# Patient Record
Sex: Male | Born: 1950 | Race: White | Hispanic: No | Marital: Married | Smoking: Never smoker
Health system: Southern US, Community
[De-identification: ages and names within clinical notes are randomized; demographics above are authoritative.]

## PROBLEM LIST (undated history)

## (undated) DIAGNOSIS — I10 Essential (primary) hypertension: Secondary | ICD-10-CM

## (undated) DIAGNOSIS — M545 Low back pain, unspecified: Secondary | ICD-10-CM

## (undated) DIAGNOSIS — K219 Gastro-esophageal reflux disease without esophagitis: Secondary | ICD-10-CM

## (undated) DIAGNOSIS — M1990 Unspecified osteoarthritis, unspecified site: Secondary | ICD-10-CM

## (undated) DIAGNOSIS — J349 Unspecified disorder of nose and nasal sinuses: Secondary | ICD-10-CM

## (undated) DIAGNOSIS — M25512 Pain in left shoulder: Secondary | ICD-10-CM

## (undated) DIAGNOSIS — Z973 Presence of spectacles and contact lenses: Secondary | ICD-10-CM

## (undated) DIAGNOSIS — G629 Polyneuropathy, unspecified: Secondary | ICD-10-CM

## (undated) DIAGNOSIS — Z860100 Personal history of colon polyps, unspecified: Secondary | ICD-10-CM

## (undated) DIAGNOSIS — R7303 Prediabetes: Secondary | ICD-10-CM

## (undated) DIAGNOSIS — K469 Unspecified abdominal hernia without obstruction or gangrene: Secondary | ICD-10-CM

## (undated) DIAGNOSIS — B360 Pityriasis versicolor: Secondary | ICD-10-CM

## (undated) DIAGNOSIS — D3613 Benign neoplasm of peripheral nerves and autonomic nervous system of lower limb, including hip: Secondary | ICD-10-CM

## (undated) DIAGNOSIS — E785 Hyperlipidemia, unspecified: Secondary | ICD-10-CM

## (undated) HISTORY — DX: Gastro-esophageal reflux disease without esophagitis: K21.9

## (undated) HISTORY — DX: Essential (primary) hypertension: I10

## (undated) HISTORY — DX: Pain in left shoulder: M25.512

## (undated) HISTORY — PX: FOOT SURGERY: SHX648

## (undated) HISTORY — DX: Benign neoplasm of peripheral nerves and autonomic nervous system of lower limb, including hip: D36.13

## (undated) HISTORY — PX: COLON SURGERY: SHX602

## (undated) HISTORY — PX: FOOT BONE EXCISION: SUR493

## (undated) HISTORY — DX: Unspecified abdominal hernia without obstruction or gangrene: K46.9

## (undated) HISTORY — DX: Personal history of colon polyps, unspecified: Z86.0100

## (undated) HISTORY — DX: Unspecified disorder of nose and nasal sinuses: J34.9

## (undated) HISTORY — PX: HEMORRHOID SURGERY: SHX153

## (undated) HISTORY — DX: Hyperlipidemia, unspecified: E78.5

## (undated) HISTORY — DX: Pityriasis versicolor: B36.0

## (undated) HISTORY — PX: APPENDECTOMY: SHX54

## (undated) HISTORY — PX: OTHER SURGICAL HISTORY: SHX169

## (undated) HISTORY — DX: Low back pain, unspecified: M54.50

## (undated) HISTORY — PX: KNEE ARTHROPLASTY: SHX992

## (undated) HISTORY — DX: Low back pain: M54.5

## (undated) HISTORY — PX: CHOLECYSTECTOMY: SHX55

## (undated) HISTORY — PX: COLONOSCOPY: SHX174

## (undated) HISTORY — DX: Personal history of colonic polyps: Z86.010

## (undated) HISTORY — DX: Unspecified osteoarthritis, unspecified site: M19.90

## (undated) HISTORY — PX: TONSILLECTOMY AND ADENOIDECTOMY: SHX28

---

## 1999-10-18 ENCOUNTER — Ambulatory Visit (HOSPITAL_COMMUNITY): Admission: RE | Admit: 1999-10-18 | Discharge: 1999-10-18 | Payer: Self-pay | Admitting: Gastroenterology

## 2002-03-22 ENCOUNTER — Observation Stay (HOSPITAL_COMMUNITY): Admission: RE | Admit: 2002-03-22 | Discharge: 2002-03-23 | Payer: Self-pay | Admitting: Orthopedic Surgery

## 2003-10-28 ENCOUNTER — Inpatient Hospital Stay (HOSPITAL_COMMUNITY): Admission: RE | Admit: 2003-10-28 | Discharge: 2003-11-03 | Payer: Self-pay | Admitting: Orthopedic Surgery

## 2004-03-12 ENCOUNTER — Observation Stay (HOSPITAL_COMMUNITY): Admission: RE | Admit: 2004-03-12 | Discharge: 2004-03-13 | Payer: Self-pay | Admitting: Surgery

## 2004-03-12 ENCOUNTER — Encounter (INDEPENDENT_AMBULATORY_CARE_PROVIDER_SITE_OTHER): Payer: Self-pay | Admitting: *Deleted

## 2004-09-29 ENCOUNTER — Ambulatory Visit (HOSPITAL_COMMUNITY): Admission: RE | Admit: 2004-09-29 | Discharge: 2004-09-29 | Payer: Self-pay | Admitting: Gastroenterology

## 2004-09-29 ENCOUNTER — Encounter (INDEPENDENT_AMBULATORY_CARE_PROVIDER_SITE_OTHER): Payer: Self-pay | Admitting: *Deleted

## 2004-10-13 ENCOUNTER — Ambulatory Visit (HOSPITAL_COMMUNITY): Admission: RE | Admit: 2004-10-13 | Discharge: 2004-10-13 | Payer: Self-pay | Admitting: Orthopedic Surgery

## 2005-03-14 HISTORY — PX: CARDIAC CATHETERIZATION: SHX172

## 2005-03-24 ENCOUNTER — Ambulatory Visit (HOSPITAL_COMMUNITY): Admission: RE | Admit: 2005-03-24 | Discharge: 2005-03-24 | Payer: Self-pay | Admitting: Orthopedic Surgery

## 2005-10-10 ENCOUNTER — Inpatient Hospital Stay (HOSPITAL_COMMUNITY): Admission: EM | Admit: 2005-10-10 | Discharge: 2005-10-11 | Payer: Self-pay | Admitting: *Deleted

## 2007-03-20 ENCOUNTER — Inpatient Hospital Stay (HOSPITAL_COMMUNITY): Admission: RE | Admit: 2007-03-20 | Discharge: 2007-03-28 | Payer: Self-pay | Admitting: Orthopedic Surgery

## 2007-03-26 ENCOUNTER — Encounter (INDEPENDENT_AMBULATORY_CARE_PROVIDER_SITE_OTHER): Payer: Self-pay | Admitting: Orthopedic Surgery

## 2007-03-26 ENCOUNTER — Ambulatory Visit: Payer: Self-pay | Admitting: Vascular Surgery

## 2010-07-27 NOTE — Op Note (Signed)
[name], [name]             ACCOUNT NO.:  [id number]   MEDICAL RECORD NO.:  [id number]          PATIENT TYPE:  INP   LOCATION:  0005                         FACILITY:  [facility]   PHYSICIAN:  Marlowe Kays, M.D.  DATE OF BIRTH:  16-Nov-1950   DATE OF PROCEDURE:  03/20/2007  DATE OF DISCHARGE:                               OPERATIVE REPORT   PREOPERATIVE DIAGNOSIS:  Osteoarthritis, left knee.   POSTOPERATIVE DIAGNOSIS:  Osteoarthritis, left knee.   OPERATION:  Osteonics total knee replacement, left.   SURGEON:  Marlowe Kays, M.D.   ASSISTANTDruscilla Brownie. Idolina Primer, P.A.-C.   PATHOLOGY AND JUSTIFICATION FOR PROCEDURE:  He has had prior successful  right total knee replacement a number of years ago.  His left knee has  become progressively more painful despite all measures other than a knee  replacement.  He has significant medial compartment narrowing on plane x-  rays with some medial shift of the femur onto the tibia.   DESCRIPTION OF PROCEDURE:  Prophylactic antibiotics, satisfactory  general anesthesia, pneumatic tourniquet, Surefoot and lateral hip  stabilizer, left leg was prepped with DuraPrep, draped in a sterile  field, Ioban employed, time out performed.  Vertical midline incision  down to the patellar mechanism with medial parapatellar incision to open  the joint.  Osteophytes from around the patella and the femur were  resected and the anterior portion of the menisci and most of the ACL and  PCL complex was removed.  The patellar mechanism was freed up enough  that the patella could be everted and the knee flexed.  I released the  pes anserinus and the tibial attachment of the medial collateral  ligament from the tibia.  I then made a 5/16 inch drill hole in the  distal femur followed by the canal finder and the axis aligner set for a  5 degrees valgus cut to the left knee.  He had no flexion contracture,  so a 10 mm distal femoral cut was made.  I then sized  the femur with the  sizing jig at #9 and placed stabilization holes using this jig and then  applied the distal femoral cutting jig for anterior and posterior cuts  and posterior and anterior chamfer.  After completion of these, I went  to the tibia where I removed remnants of the ACL and PCL complex and the  menisci.  A leveling cut was made and the tibia sized at a #9.  Using  the #9 baseplate, I made my initial drill hole in the proximal tibia  followed by the step cut drill, canal finder, and the intramedullary rod  with external cutting jig for a 90 degree cut of 4 mm off the medial  tibial plateau.  Having made this cut, I then placed the laminar  spreader and removed remnants of bone and soft tissue from the height of  the femoral condyles.  I next placed the jig for creating the  notchplasty in the patellar groove, both of which were performed without  complication.  I then went through a trial reduction, found that a 10 mm  spacer seemed to give an excellent fit.  Using the external alignment  rods, I marked scribe lines on the tibia for eventual placement of the  tibial component.  While the knee was in extension, I then sized the  patella and used a re-surfacing 9, created three drill holes.  I then  returned to the tibia.  I placed the 9 baseplate based on the previously  noted scribe lines and used the tripod apparatus to ream for the tibial  keel up to a #9.  I filed this by using the hand reamer for the 80 mm  extension which I felt was appropriate in this particular case because  of his varus deformity and he is a large man.  I reamed up to a 14 mm  requiring the use of a Boss reamer to the proximal tibia and tibial  plateau.  I then went ahead and went through a trial reduction with the  tibial component with a 14 by 80 mm extension and it fit nicely.  I then  went ahead and water picked the knee while the components were opened  and assembled.  We then mixed the methyl  methacrylate and inserted it  with a glue gun, placing some glue on the posterior flanges of the  femoral component and on the under surface of the tibial component down  to the extension.  The components were then individually glued in and  impacted with excess methacrylate being removed.  A patellar clamp was  used on the patella. The glue had hardened and small amounts of  methacrylate from around the components were removed, and I went through  a second trial reduction and found out, once again, that the 10 mm  spacer was the best fit.  Accordingly, after irrigating the wound well  and making sure there were no particulate pieces of bone or methyl  methacrylate or any soft tissues interposed, I then placed the final  posterior stabilized 10 mm spacer, impacting it and taking the knee  through range of motion with nice mechanics for the knee, no flexion  contracture, and good stability.  The patella did not require a lateral  release.  A Hemovac was then placed and the wound closed with  interrupted #1 Vicryl in two layers in the quadriceps tendon and two  layers distally in the synovium and capsule, the subcutaneous tissues  were closed with 2-0 Vicryl, the skin was closed with staples.  The  tourniquet was released with 2 hours 10 minutes of tourniquet time  having lapsed at 300 mmHg.  A Betadine and Adaptic dry, sterile dressing  were applied.  He tolerated the procedure well and was taken to the  recovery room in satisfactory condition with no known complications.           ______________________________  Marlowe Kays, M.D.     JA/MEDQ  D:  03/20/2007  T:  03/20/2007  Job:  [id number]

## 2010-07-27 NOTE — H&P (Signed)
[name], [name]             ACCOUNT NO.:  [id number]   MEDICAL RECORD NO.:  [id number]          PATIENT TYPE:  INP   LOCATION:  NA                           [facility]ACILITY:  [facility]   PHYSICIAN:  Marlowe Kays, M.D.  DATE OF BIRTH:  [birth year]   DATE OF ADMISSION:  03/20/2007  DATE OF DISCHARGE:                              HISTORY & PHYSICAL   CHIEF COMPLAINT:  Pain in my left knee.   PRESENT ILLNESS:  This 60 year old white male seen by [place] for continuing  progressive problems concerning pain in his left knee.  He has had  problems with this knee, has undergone left knee arthroscopy in 2004,  and has done fairly well since then, but now it is markedly interfering  with his day-to-day activities.  He has had a right total knee  replacement arthroplasty, has done very well with that.  That surgery  was performed in 2005.  He has developed a varus deformity as well as  leg shortening.  Due to this fact, he now has intermittent low-back pain  and discomfort.  It is felt it is stemming primarily from the  malalignment of the left lower extremity.  X-rays showed bone-on-bone  deformity in the medial joint with tricompartmental arthritis present as  well.  After much consideration, including the risks and benefits of  surgery, it was decided to go ahead with total knee replacement  arthroplasty to the left knee.   PAST MEDICAL HISTORY:  1. This patient has hypertension.  2. Reflux.  3. Hypercholesterolemia.   PAST SURGICAL HISTORY:  His previous surgeries have been  1. Appendectomy in the past.  2. Tonsillectomy.  3. Hemorrhoidectomy.  4. He has had several colonoscopies, one in 1990, 1995, 2000 and 2006.  5. Right knee arthroscopy in 1996 and 1997, resulting in a total knee      in 2005.  6. He had left knee arthroscopy in 2004.  7. Left foot surgery in 2004, as well.  8. Cholecystectomy in 2005.  9. Right foot surgery in 2006.  10.He had another left knee scope in 2007.  11.An essentially negative heart catheterization done in 2007.   ALLERGIES:  He is allergic to CODEINE and DEMEROL, but he can take  hydrocodone and oxycodone without too much problem.   CURRENT MEDICATIONS:  Under the direction of Dr. Duane Lope,  1. Benicar/hydrochlorothiazide 40/12.5 mg one daily.  2. Pravastatin sodium 40 mg one tablet daily.  3. Protonix 40 mg one tablet daily.  4. He is also supplementing that with aspirin 81 mg daily and fish oil      omega three 1200 mg supplement 1 tablet daily (will stop these two      last medications 10 days prior to surgery).  5. He has used Toradol in the past for discomfort, but does not take      it at the current time.   FAMILY HISTORY:  Positive for his father who died at 37 years of age of  a heart attack and mother at 84 years of age for heart attack.  He has  one sister  who died of heart failure.   SOCIAL HISTORY:  The patient is married.  He is retired.  No intake of  tobacco products.  Occasional intake of EtOH.  No street drugs.  The  patient lives with his family.  His wife will be a caregiver after  surgery along with home health.  Housing:  He lives in a two level home.  He has a living will.   REVIEW OF SYSTEMS:  CNS:  No seizures disorder, paralysis, numbness,  double vision.  RESPIRATORY:  No productive cough, no hemoptysis or  shortness of breath.  CARDIOVASCULAR:  No chest pain, no angina or  orthopnea.  GASTROINTESTINAL:  No nausea, vomiting, melena or bloody  stool.  GENITOURINARY:  No discharge, dysuria or hematuria.  MUSCULOSKELETAL:  Primarily in present illness, his left knee.   PHYSICAL EXAMINATION:  GENERAL APPEARANCE:  Alert, cooperative and  friendly, slightly anxious 60 year old white male who is 6 feet 2 and  230 pounds.  VITAL SIGNS:  Blood pressure 138/78, respirations 12, pulse 80.  HEENT: Normocephalic, PERRLA.  Oropharynx is clear.  CHEST:  Clear to auscultation, no rhonchi or rales.  HEART:  Regular rate and rhythm.  No murmurs are heard.  ABDOMEN:  Somewhat obese, soft, nontender.  Liver and spleen not felt.  GENITALIA/RECTAL:  Not done, not pertinent to present illness.  EXTREMITIES:  The patient has good neurovascular to left lower  extremities, crepitus of range of motion and varus deformity as  mentioned above.   ADMISSION DIAGNOSIS:  1. 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  2. Hypertension.  3. Dyslipidemia.  4. Reflux.   PLAN:  The patient will undergo left total knee replacement  arthroplasty.  We do plan to have him go home after his regular  hospitalization with home health with [place].  His wife will be the  major caregiver.      [name].    ______________________________  Marlowe Kays, M.D.    DLU/MEDQ  D:  03/06/2007  T:  03/07/2007  Job:  [id number]   cc:   C. Duane Lope, M.D.  Fax: [phone]

## 2010-07-27 NOTE — Consult Note (Signed)
[name], [name]             ACCOUNT NO.:  [id number]   MEDICAL RECORD NO.:  [id number]          PATIENT TYPE:  INP   LOCATION:  1617                         FACILITY:  [facility]   PHYSICIAN:  Shirley Friar, MDDATE OF BIRTH:  Dec 18, 1950   DATE OF CONSULTATION:  DATE OF DISCHARGE:                                 CONSULTATION   REQUESTING PHYSICIAN:  Dr. Simonne Come   INDICATION:  Abdominal pain.   HISTORY OF PRESENT ILLNESS:  Mr. [name] is a 60 year old pleasant  white male who is status post total knee replacement on the left on  March 20, 2007.  Since surgery he has developed some left lower  quadrant and suprapubic abdominal pain and distention and therefore this  consult was called for evaluation.  He does have a history of  postoperative ileus when he had his right knee replaced in the past.  He  reports that he ate breakfast yesterday and lunch yesterday without any  problems but then the pain worsened that afternoon and he was unable to  eat dinner.  He was given a suppository yesterday and reported loose  stools following that suppository.  He denies any vomiting but has had  some nausea.   PAST MEDICAL HISTORY:  1. Hypertension.  2. Hypercholesterolemia.  3. History of reflux.  4. Status post cholecystectomy.  5. History of polyps.  6. History of feet and knee surgery.   MEDICINES ON ADMISSION:  Aspirin, Protonix, pravastatin,  Benicar/hydrochlorothiazide, ibuprofen, ketorolac, Tylenol, fish oil.   CURRENT MEDICATIONS:  See chart.   ALLERGIES:  1. MORPHINE.  2. DEMEROL.  3. CODEINE/   Codeine causes a rash, morphine causes nausea, vomiting and diarrhea,  and Demerol causes nausea and vomiting.   PHYSICAL EXAM:  VITAL SIGNS:  Temperature 100.3, pulse 95, blood  pressure 120/77, O2 saturations 93% on room air.  GENERAL:  Alert, no acute distress.  ABDOMEN:  Distended, tender in left lower quadrant with guarding, no  rebound, positive bowel sounds.   LABORATORIES:  White blood count 14.1, hemoglobin 12.1, platelet count  224.  INR 1.5.  Potassium 3.5.   IMPRESSION:  A 60 year old white male with abdominal pain and abdominal  distention concerning for ileus versus small-bowel obstruction.  X-ray  was done today which showed no air-fluid level but did show some  distention of the abdomen likely due to ileus.  I think he has  postoperative ileus and not an obstruction.  No indication to place an  NG tube at this time.  Will schedule IV Reglan 10 mg q.6h. x2 days and  follow serial x-rays.  Place the patient on clear-liquid diet and follow  closely.   Thank you for this consultation and opportunity to help manage his  abdominal pain.      Shirley Friar, MD  Electronically Signed     VCS/MEDQ  D:  03/22/2007  T:  03/22/2007  Job:  [id number]   cc:   Danise Edge, M.D.  Fax: [phone]   Marlowe Kays, M.D.  Fax: [phone]

## 2010-07-27 NOTE — Op Note (Signed)
NAMESTILES, [name] NO.:  [id number]   MEDICAL RECORD NO.:  [id number]          PATIENT TYPE:  INP   LOCATION:  1617                         FACILITY:  [facility]   PHYSICIAN:  Danise Edge, M.D.   DATE OF BIRTH:  11/01/1950   DATE OF PROCEDURE:  03/28/2007  DATE OF DISCHARGE:                               OPERATIVE REPORT   PROCEDURE INDICATIONS:  Mr. Rayshard Schirtzinger is a 60 year old male born  1950-11-27.  Mr. Zechman developed a postoperative ileus after  the surgery.  Repeat abdominal x-rays are consistent with either an  ileus or distal colonic obstruction.  The CT scan of the abdomen and  pelvis without intravenous contrast performed yesterday evening was  unremarkable; there was no signs of retroperitoneal bleeding.   ENDOSCOPIST:  Danise Edge, M.D.   PREMEDICATION:  None.   PROCEDURE:  Anal inspection and digital rectal exam were normal.  The  Pentax pediatric colonoscope was introduced into the rectum and advanced  to approximately 80 cm from the anal verge at which point a large amount  of predominantly liquid stool was encountered.   Endoscopic appearance of the rectum and colon with the endoscope  advanced to 80 cm from the anal verge was completely normal.  There is  no endoscopic evidence for the presence of colonic obstruction, C.  difficile colitis.   ASSESSMENT:  Resolving postoperative ileus.           ______________________________  Danise Edge, M.D.     MJ/MEDQ  D:  03/28/2007  T:  03/28/2007  Job:  [id number]   cc:   Marlowe Kays, M.D.  Fax: [phone]

## 2010-07-30 NOTE — H&P (Signed)
[name], [name]             ACCOUNT NO.:  [id number]   MEDICAL RECORD NO.:  [id number]          PATIENT TYPE:  INP   LOCATION:  1422                         [facility]ACILITY:  [facility]   PHYSICIAN:  Kela Millin, M.D.DATE OF BIRTH:  Dec 31, 1950   DATE OF ADMISSION:  10/10/2005  DATE OF DISCHARGE:                                HISTORY & PHYSICAL   PRIMARY CARE PHYSICIAN:  Dr. Foye Deer.   CHIEF COMPLAINT:  Chest pain.   HISTORY OF PRESENT ILLNESS:  The patient is a 60 year old white male with a  past medical history significant for hypertension and hyperlipidemia who  presents with complaints of chest pain x2 hours. He states that upon waking  up in the morning he began experiencing pain that went through his back to  the shoulder blade area. Shortly thereafter it progressed to a left  precordial discomfort/pain that he describes as a tightness, 3-4/10 in  intensity and he states that he felt flushed. He states that the pain also  radiated up his neck to the jaw area and that he felt some numbness around  the jaw area, and also that the pain radiated down his left arm with some  tingling. He states that he has had some cervical degenerative disk disease  and so intermittently he does have some pain in his shoulder that radiates  down his arm with some paresthesias at times but that he has attributed most  of the time to the cervical disk disease but that this time the symptoms  felt different than in those times. He denies nausea and vomiting, ?  shortness of breath. He states that he is not aware of any precipitating  factors and that upon arrival to the ER, he was given a nitroglycerin but he  is not sure whether it made any difference to the pain although the pain  resolved sometime after he got to the ER. He also states that he had some  epigastric burning sensation while he was in the ER and in the past he has  had similar symptoms but never been diagnosed or treated for  GERD.   The patient was seen in the ER and an EKG showed normal sinus rhythm with no  ischemic changes, his point of care markers were negative and he was  admitted to the [facility] hospitalist service for further evaluation and  management.   PAST MEDICAL HISTORY:  1. As stated above.  2. History of osteoarthritis - status post right knee replacement also      status post left knee surgery x2.  3. History of colitis.   PAST SURGICAL HISTORY:  History of cholecystectomy, also history of  appendectomy.   MEDICATIONS:  1. Aspirin 81 mg daily.  2. Benicar 40/12.5 mg daily.  3. Pravachol 80 mg p.o. daily.  4. Zetia 10 mg p.o. daily.   ALLERGIES:  CODEINE and DEMEROL.   SOCIAL HISTORY:  He denies tobacco, also denies alcohol.   FAMILY HISTORY:  His dad died of an MI at age 29, mom is deceased, had her  first MI at age 37. Her sisters  have diabetes mellitus, mom and dad also had  diabetes mellitus, sisters also have hypertension.   REVIEW OF SYSTEMS:  The patient denies cough, fevers, PND, orthopnea, leg  swelling, melena, dysuria, and no hematochezia. The rest of the review of  systems as per HPI.   PHYSICAL EXAMINATION:  GENERAL:  The patient is a pleasant, middle-aged  male, alert and oriented in no apparent distress.  VITAL SIGNS:  His temperature is 98.6, blood pressure 145/93, initially  166/105. His pulse is 65, initially 132, respiratory rate is 20. O2 sat is  97%.  HEENT:  PERRL, EOMI, sclera anicteric, moist mucous membranes, no oral  exudates.  NECK:  Supple, no adenopathy, no thyromegaly, no JVD.  LUNGS:  Clear to auscultation bilaterally, no crackles or wheezes.  CARDIOVASCULAR:  Regular rate and rhythm, normal S1, S2, no S3 appreciated.  ABDOMEN:  Soft, bowel sounds present, nontender, nondistended, no  organomegaly and no masses palpable.  EXTREMITIES:  No cyanosis and no edema.  NEUROLOGIC:  Alert and oriented x3. Cranial nerves II-XII grossly intact.  Sensory  grossly intact. Nonfocal exam.   LABORATORY DATA:  EKG - normal sinus rhythm, no ischemic changes, D-dimer is  0.28. Point of care markers negative x2. Chest x-ray - no acute findings.   His sodium is 137, potassium 4.1, chloride 107, CO2 is 25, glucose 103, BUN  11, creatinine 1.1. His calcium is 9.1, total protein 7.0, albumin 12.1, AST  34, ALT 54, alkaline phosphatase 53, total bilirubin 0.8.   ASSESSMENT/PLAN:  1. Chest pain - As discussed above, risk factors, hypertension and      hyperlipidemia. Will obtain serial cardiac enzymes and also place the      patient on aspirin, nitrates. Manage her on telemetry bed. Consult      cardiology for possible stress test if cardiac enzymes negative. Will      also place patient on proton pump inhibitor to cover for      gastrointestinal etiology.  2. Hypertension - Will continue Benicar HCT.  3. Hyperlipidemia - Continue Zetia and Pravachol.      Kela Millin, M.D.  Electronically Signed     ACV/MEDQ  D:  10/11/2005  T:  10/11/2005  Job:  [id number]   cc:   Dellis Anes. Idell Pickles, M.D.  Fax: [phone]

## 2010-07-30 NOTE — Op Note (Signed)
NAMESKYELAR, [name]             ACCOUNT NO.:  [id number]   MEDICAL RECORD NO.:  [id number]          PATIENT TYPE:  AMB   LOCATION:  ENDO                         FACILITY:  [facility]   PHYSICIAN:  Danise Edge, M.D.   DATE OF BIRTH:  14-Nov-1950   DATE OF PROCEDURE:  09/29/2004  DATE OF DISCHARGE:                                 OPERATIVE REPORT   PROCEDURE:  Colonoscopy.   REFERRING PHYSICIAN:  Dr. Foye Deer.   INDICATIONS:  [name] is a 60 year old male born April 22, 1950. [name] father had colon cancer. His sister and brother have  undergone colonoscopic exams to remove colon polyps.   ENDOSCOPIST:  Danise Edge, M.D.   PREMEDICATION:  Versed and 10 mg, fentanyl 75 mcg.   DESCRIPTION OF PROCEDURE:  After obtaining informed consent, [name]  was placed in the left lateral decubitus position. I administered  intravenous fentanyl and intravenous Versed to achieve conscious sedation  for the procedure. The patient's blood pressure, oxygen saturation and  cardiac rhythm were monitored throughout the procedure and documented in the  medical record.   Anal inspection was normal. Digital rectal exam reveals a nonnodular  prostate. The Olympus adjustable pediatric colonoscope was introduced into  the rectum and advanced to the cecum. A normal-appearing ileocecal valve and  appendiceal orifice were identified and photographed. Colonic preparation  exam today was excellent.   RECTUM:  Two 1 mm sessile polyps were removed from the mid rectum with the  cold biopsy forceps.  SIGMOID COLON AND DESCENDING COLON:  Normal.  SPLENIC FLEXURE:  Normal.  TRANSVERSE COLON:  Normal.  HEPATIC FLEXURE:  Normal.  ASCENDING COLON:  Normal.  CECUM AND ILEOCECAL VALVE:  Normal. Two small diverticula are noted in the  cecum.   ASSESSMENT:  Two diminutive polyps were removed from the rectum with the  cold biopsy forceps; otherwise normal proctocolonoscopy to the  cecum.   RECOMMENDATIONS:  Repeat colonoscopy in five years.       MJ/MEDQ  D:  09/29/2004  T:  09/29/2004  Job:  [id number]   cc:   Dellis Anes. Idell Pickles, M.D.  [address]  [place]  [place] [address]  Fax: [phone]

## 2010-07-30 NOTE — H&P (Signed)
NAME:  [name], [name]                       ACCOUNT NO.:  [id number]   MEDICAL RECORD NO.:  [id number]                   PATIENT TYPE:  INP   LOCATION:  0468                                 FACILITY:  [facility]   PHYSICIAN:  Marlowe Kays, M.D.               DATE OF BIRTH:  [birth year]   DATE OF ADMISSION:  10/28/2003  DATE OF DISCHARGE:  11/03/2003                                HISTORY & PHYSICAL   ADMISSION DIAGNOSES:  1.  Severe osteoarthritis of right knee.  2.  Hypertension.  3.  Resolving colitis.   DISCHARGE DIAGNOSES:  1.  Severe osteoarthritis of right knee, status post total knee replacement.  2.  Hypertension.  3.  Resolving colitis.  4.  Postoperative anemia.  5.  Postoperative abdominal distention, resolved.   OPERATION:  On October 28, 2003, the patient underwent Osteonics total knee  replacement arthroplasty of the right knee.  Dr. Durene Romans assisted.   CONSULTATIONS:  Danise Edge, M.D., gastroenterology.   BRIEF HISTORY:  This 60 year old gentleman had continued progressive  problems concerning pain in his right knee.  The patient has had some trauma  in the past and now presents with medial compartment narrowing and near bone-  on-bone deformity to his both knees.  He is having more pain to the right  knee.  Left knee has shown radiographic changes.  He felt he would benefit  from surgical intervention and is being admitted for total knee replacement  arthroplasty of the right knee.   The patient's family physician is Dellis Anes. Heller, M.D.   COURSE IN HOSPITAL:  The patient tolerated the surgical procedure quite  well.  He was placed on Coumadin protocol postoperatively for prevention of  DVT.  He was seen by both physical therapy and occupational therapy for ADLs  and total knee protocol.  The patient was doing well until the postoperative  day #3 when he began having distention with nausea and vomiting.  We asked  Dr. Laural Benes to see this patient.  He  recommended we cut way back on the  narcotics.  This seemed to relieve his abdominal discomfort.  The C.  difficile toxin was negative.  The x-rays showed no dilatation, and it was  assumed he just had a postoperative distention secondary to hypoactivity of  the gut secondary to analgesics.   As far as his knee was concerned, he progressed very nicely.  Arrangements  were made for him to be seen at home with [facility].  On the day of discharge,  seen by Dr. Simonne Come, had minimal pain, anxious to go home with home  health.  Arrangements were made for discharge.   LABORATORY AND X-RAY DATA:  Laboratory values in the hospital  hematologically showed a preoperative CBC completely within normal limits.  Final hemoglobin was 9.2, hematocrit 26.8.  Blood chemistries all were  normal other than a very mild hypokalemia and hyponatremia postoperatively.  C.  difficile toxin was negative.  Urinalysis was negative as well.   Electrocardiogram showed normal ECG.   Chest x-ray showed no active disease.   CONDITION ON DISCHARGE:  Improved, stable.   PLAN:  The patient is discharged to his home in the care of his family.  He  is to continue with his home medication and diet under the direction of Dr.  Foye Deer.   OUR MEDICATIONS:  1.  Coumadin per pharmacy  2.  Darvocet-N 100, #30 with a refill, 1 to 2 q.4-6h. p.r.n. pain.  3.  Robaxin, #30 with a refill, 1 q.6h. p.r.n. muscle spasm.   Continue with home physical therapy.     [name].                 Marlowe Kays, M.D.    DLU/MEDQ  D:  11/18/2003  T:  11/18/2003  Job:  [id number]   cc:   Dr. Foye Deer [phone])

## 2010-07-30 NOTE — Op Note (Signed)
[name], [name]             ACCOUNT NO.:  [id number]   MEDICAL RECORD NO.:  [id number]          PATIENT TYPE:  OUT   LOCATION:  DAY                          FACILITY:  [facility]   PHYSICIAN:  Marlowe Kays, M.D.  DATE OF BIRTH:  [birth year]   DATE OF PROCEDURE:  03/24/2005  DATE OF DISCHARGE:                                 OPERATIVE REPORT   PREOPERATIVE DIAGNOSES:  1.  Torn medial meniscus.  2.  Osteoarthritis, left knee.   POSTOPERATIVE DIAGNOSES:  1.  Torn medial meniscus.  2.  Fraying of lateral meniscus.  3.  Osteoarthritis.  4.  Osteocartilaginous fragments with impingement symptoms in medial joint.   OPERATION:  Left knee arthroscopy with 1) partial medial and lateral  meniscectomy, 2) debridement of medial femoral condyle, 3) removal of  osteochondral fragment from medial joint.   SURGEON:  Marlowe Kays, M.D.   ASSISTANT:  Nurse.   ANESTHESIA:  General.   PATHOLOGY AND JUSTIFICATION FOR PROCEDURE:  He is known to have  osteoarthritis of his left knee and has had a prior knee arthroscopy. He was  doing well however until March 15, 2005 when he slipped and fell on some  bird seed at a [employer]. He had multiple contusions, but the major  injury seemed to be to his left knee. I saw him twice on March 16, 2005 and  an MRI was ordered on March 22, 2005 demonstrating a torn medial meniscus  and osteoarthritis of the knee. The fragment was not noted on the MRI. It  was my thought at surgery that this was the dominant finding causing him to  have his feelings of locking and pain.   DESCRIPTION OF PROCEDURE:  Prophylactic antibiotics, because of prior right  knee replacement, the right leg was wrapped with Ace wrap and protected with  a pad beneath it, pneumatic tourniquet applied to the left leg which was  esmarched out sterilely and prepped with DuraPrep from thigh stabilizer to  ankle and draped in a sterile field. Superior medial saline inflow.  First  through an anteromedial portal, the lateral compartment of the knee joint  was evaluated. He had some mild arthritic changes, grade 2/4 of the lateral  femoral condyle with some minimal shaving performed. He also had a similar  amount of wear of the lateral tibial plateau. There was some moderate  fraying of the inner border of the lateral meniscus as noted on the MRI. I  shaved down the lateral meniscus and the lateral tibial plateau. Next  looking up in the lateral gutter and suprapatellar area, the patellar  surface looked unremarkable. I then reversed portals. I found areas of full-  thickness wear in both the tibial plateau and medial femoral condyle with  several areas of recurrent tears along the posterior curve. The dominant  finding as noted, however was a slightly greater than 1 cm x slightly  greater than 1/2 a centimeter osteochondral fragment lying adjacent to the  ACL medially which was impinged between the medial femoral condyle and the  medial tibial plateau. This was pictured, removed with a pituitary  rongeur  and saved for the patient. I then debrided down the medial femoral condyle  and shaved down the medial meniscus until smooth with a 3.5 shaver. The knee  was then irrigated well with sterile saline until clear and the two anterior  portals were closed with 4-0 nylon. I then injected 20 mL of  0.5% Marcaine  with adrenalin through the inflow apparatus which was removed and this  portal closed with 4-0 nylon as well. Betadine Adaptic dry sterile dressing  were applied. The tourniquet was released. He tolerated the procedure well  and was taken to the recovery room in satisfactory condition with no known  complications.           ______________________________  Marlowe Kays, M.D.     JA/MEDQ  D:  03/24/2005  T:  03/25/2005  Job:  [id number]

## 2010-07-30 NOTE — Procedures (Signed)
[facility]  Patient:    Steve, Crawford                      MRN: [id number] Attending:  Verlin Grills, M.D. CC:         Dellis Anes. Idell Pickles, M.D.                           Procedure Report  REFERRING PHYSICIAN:  Dellis Anes. Idell Pickles, M.D.  PROCEDURE PERFORMED:  Colonoscopy.  ENDOSCOPIST:  Verlin Grills, M.D.  INDICATIONS FOR PROCEDURE:  [name] is a 59 year old male whose father was diagnosed with colon cancer.  [name] is due for colorectal neoplasia screening.  His 10 year old sister recently underwent a colonoscopy and multiple polyps were removed.  [name] underwent his last colonoscopy in 1996 which was normal.  I discussed with Mr. Ring the complications associated with colonoscopy and polypectomy including intestinal bleeding and intestinal perforation. [name] has signed the operative permit.  PREMEDICATION:  Versed 10 mg, Demerol 50 mg.  ENDOSCOPE:  Pediatric Olympus colonoscope.  DESCRIPTION OF PROCEDURE:  After obtaining informed consent, the patient was placed in the left lateral decubitus position.  I administered intravenous Demerol and intravenous Versed to achieve conscious sedation for the procedure.  The patients blood pressure, oxygen saturation and cardiac rhythm were monitored throughout the procedure and documented in the medical record.  Anal inspection was normal.  Digital rectal exam revealed a nonnodular prostate.  The Olympus pediatric video colonoscope was then introduced into the rectum and under direct vision, advanced to the cecum as identified by a normal-appearing ileocecal valve.  Colonic preparation for the exam today was excellent.  Rectum:  Normal.  Sigmoid colon:  Normal.  Descending colon:  Normal.  Splenic flexure:  Normal.  Transverse colon:  Normal.  Hepatic flexure:  Normal.  Ascending colon:  Normal.  Cecum and ileocecal valve:  Normal.  ASSESSMENT:   Normal proctocolonoscopy to the cecum.  RECOMMENDATIONS:  Repeat colonoscopy in five years. DD:  10/18/99 TD:  10/19/99 Job: [id number]

## 2010-07-30 NOTE — Op Note (Signed)
[name], [name]             ACCOUNT NO.:  [id number]   MEDICAL RECORD NO.:  [id number]           PATIENT TYPE:   LOCATION:                                 FACILITY:   PHYSICIAN:  Marlowe Kays, M.D.       DATE OF BIRTH:   DATE OF PROCEDURE:  10/13/2004  DATE OF DISCHARGE:                                 OPERATIVE REPORT   PREOPERATIVE DIAGNOSIS:  Painful osteoarthritis, metacarpophalangeal joint,  left great toe.   POSTOPERATIVE DIAGNOSIS:  Painful osteoarthritis, metacarpophalangeal joint,  left great toe.   OPERATION:  Bunionectomy with debridement of metacarpophalangeal joint, left  great toe.   SURGEON:  Marlowe Kays, M.D.   ASSISTANT:  Nurse.   ANESTHESIA:  General.   JUSTIFICATION FOR PROCEDURE:  He has had a similar procedure on the right in  the past with good result.  Radiographically, he has had severe spurring  with at least one fragment in the dorsum of the MP joint on lateral x-ray in  the left great toe and over the last week has had severe pain and swelling  which has been incapacitating to him so he has been unable to walk.  Consequently is here today for the above-mentioned surgery.   PROCEDURE:  Prophylactic antibiotics, satisfactory general anesthesia,  pneumatic tourniquet. The leg was Esmarch out non-sterilely and prepped from  above the ankle to toes with DuraPrep and draped in a sterile field.  A  bunion type dorsal medial incision was made.  The dorsal sensory nerve was  protected.  The capsule was opened in line with the skin incision and the MP  joint exposed.  With a small double action rongeur.  I performed the  bunionectomy.  The bunion was not very prominent and then began removing a  large osteophyte from the dorsum of the first metatarsal head which gave me  access to removing a large single bony fragment which was lying free in the  dorsum of the MP joint.  I then continued debridement of the first  metatarsal head working  laterally, removing all bone,  and I felt I had good  visualization of the entire joint.  At the conclusion of the debridement, he  had excellent motion passively of the MP joint.  He then irrigated well with  sterile saline, infiltrated soft tissues with 0.5% plain Marcaine and closed  the wound with interrupted 0 Vicryl in the capsule and interrupted 4-0  ______ sutures in the skin and subcutaneous tissue.  Betadine, Adaptic and a  dry sterile dressing were applied. Tourniquet was released.  At the time of  dictation, he was on his way to the recovery room in satisfactory condition  with no known complications.           ______________________________  Marlowe Kays, M.D.     JA/MEDQ  D:  10/13/2004  T:  10/13/2004  Job:  [id number]

## 2010-07-30 NOTE — Discharge Summary (Signed)
[name]             ACCOUNT NO.:  [id number]   MEDICAL RECORD NO.:  [id number]          PATIENT TYPE:  INP   LOCATION:  1617                         FACILITY:  [facility]   PHYSICIAN:  Marlowe Kays, M.D.  DATE OF BIRTH:  01/10/1951   DATE OF ADMISSION:  03/20/2007  DATE OF DISCHARGE:  03/28/2007                               DISCHARGE SUMMARY   ADMITTING DIAGNOSES:  1. 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  2. Hypertension.  3. Dyslipidemia.  4. Reflux.   DISCHARGE DIAGNOSES:  1. 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  2. Hypertension.  3. Dyslipidemia.  4. Reflux.  5. Postoperative hyponatremia (corrected).  6. Dysmotility ileus postoperatively.   CONSULTS:  Gastroenterology including Dr. Laural Benes.   OPERATIONS:  On on March 20, 2007, the patient underwent left total  knee replacement arthroplasty.  D.L. Idolina Primer PA-C assisted.   BRIEF HISTORY:  This 60 year old gentleman seen by [place] for progressive  problems concerning the left knee.  He had a knee scope to the knee in  2004, did fairly well, but recently he has had increasing problems with  that knee.  He underwent a right total knee replacement arthroplasty,  did very well and now has high desires to increase his level of  activity, with total knee replacement arthroplasty on the left.  X-ray  showed bone-on-bone deformities, primarily in the medial joint, but he  has tricompartmental arthritis as well.  After much discussion including  risks and benefits of surgery, decided to go ahead with the above  surgery.   COURSE IN THE HOSPITAL:  The patient tolerated surgical procedure quite  well, but immediately began having abdominal discomfort with tenderness  to palpation.  Bowel sounds remained present early on, but he was quite  miserable.  He also had been noted to have some hyponatremia at 130.  High sodium drinks were offered, and this eventually was  corrected.   Gastroenterology felt that the patient had a postoperative ileus.  We  treated him with laxatives and diet change.  Gastroenterologist slowly  increased his diet, but he continued with pain, specifically in the  upper abdominal region.  Dr. Laural Benes saw the patient, did a with  flexible colonoscopy.  He saw that there was no colonic obstruction, no  colitis, no neoplasia.  He felt that primarily he was dealing with a  dysmotility ileus, and the patient was relieved after that procto-  colonoscopy at 80 cm.  Dr. Laural Benes recommended he stay on Reglan.   Orthopedically, the patient did very well postoperatively.  Wound  remained clean and dry, neurovascularly remained intact to the operative  extremity, calf remained soft.  He was ambulating in the hall, doing  really quite well with his new total knee.  He is able to tolerate the  CPM machine and after actually asked for it often.  Once he was cleared  by gastroenterology and was orthopedically stable, it was felt that he  could be discharged home with home therapy.   LABORATORY VALUES:  In the hospital, hematologically showed a  preoperative CBC with a hemoglobin of 14.1,  hematocrit was 40.5.  Final  hemoglobin was 9.4, hematocrit of 26.6 (he was asymptomatic).  Final PT  was 23.0, with an INR 2.0.  (The patient was on Coumadin protocol  postoperatively after Lovenox immediately, postoperatively.  Final  electrolytes showed a sodium of 137, potassium 3.8, glucose slightly  elevated at 106.  Urinalysis negative for urinary tract infection.  Electrocardiogram showed normal sinus rhythm, normal ECG.  Chest x-rays  preoperatively showed findings:  Dr. Elly Modena with no active  cardiopulmonary disease.  Postoperative knee films on the left showed  components in good position alignment.  Abdominal studies on March 22, 2007 showed mild diffuse gaseous distention of the colon, with air  scattered throughout the small bowel,  appearance not frankly  obstructive, more suggestive of dysmotility ileus.  Studies done on  March 23, 2007 showed gas-filled small bowel loops suggestive of ileus  without free peritoneal air.  The abdomen CT showed he had no pulmonary  embolus, atelectasis of left lung lobe was seen, no airspace disease.  Overall, Dr. Carlota Raspberry felt that technically adequate study without  pulmonary embolus or acute findings in the chest, fatty liver and  cholecystectomy, mild dependent atelectasis, subsegmental atelectasis  left lower lobe.  X-rays March 25, 2007 showed consistency with  postoperative ileus, not resolved compared to March 23, 2007 by Dr.  Mayford Knife.  Dr. Hope Budds, on abdomen films of March 26, 2007, felt that  ileus was present.  CT of the pelvis showed negative pelvis CT and no  retroperitoneal hemorrhage.   CONDITION ON DISCHARGE:  Improved, stable.   PLAN:  The patient will continue with Reglan, per Dr. Laural Benes.  He is to  continue with home medications, Benicar, Pravastatin, Protonix.  Return  to see [place], approximately 2 weeks after date of surgery.  May weight bear  as tolerated.  He is to call, should he have any problems or questions,  use ice to the knee.      [name].    ______________________________  Marlowe Kays, M.D.    DLU/MEDQ  D:  04/11/2007  T:  04/11/2007  Job:  [id number]   cc:   Marlowe Kays, M.D.  Fax: [phone]

## 2010-07-30 NOTE — H&P (Signed)
NAME:  [name], [name]                       ACCOUNT NO.:  [id number]   MEDICAL RECORD NO.:  [id number]                   PATIENT TYPE:  INP   LOCATION:  NA                                   FACILITY:  [facility]   PHYSICIAN:  Marlowe Kays, M.D.               DATE OF BIRTH:  April 03, 1950   DATE OF ADMISSION:  10/28/2003  DATE OF DISCHARGE:                                HISTORY & PHYSICAL   DATE OF HISTORY AND PHYSICAL:  October 16, 2003.   CHIEF COMPLAINT:  Pain in my right knee.   HISTORY OF PRESENT ILLNESS:  This is a 60 year old white male seen by Dr.  Simonne Come for continuing and progressive problems concerning his right knee.  The patient has had knee pain for a long period of time.  He had primary  onset beginning in the fall of 2004 when he was deer hunting and got tangled  with his dogs.  This primarily aggravated his shoulder and AC joint but it  aggravated his knees as well.  He has had locking and catching into the knee  with a considerable amount of pain and discomfort.  He has particular  problems with squatting and coming to a standing position.  We have  discussed the possibility and probability of total knee replacement in the  future with this gentleman and, unfortunately, it has come to the point  where he is having more and more difficulty getting about.  He has grating  with range of motion and a valgus deformity.  X-rays have shown medial  compartment narrowing with joint space near-obliteration on both knees.  His  left knee actually looks worse radiographically, but he is having more pain  into the right knee.  After much discussion, including the complications of  surgery, it was decided to go ahead with total knee replacement arthroplasty  to the right knee.   PAST MEDICAL HISTORY:  This patient is a patient of Dellis Anes. Idell Pickles, M.D.   CURRENT MEDICATIONS:  1. Nabumetone 750 mg one b.i.d.  2. Benicar hydrochloride 40/25 mg one tablet daily.  3. Pravachol 40  mg one tablet daily.  4. Darvocet for discomfort.  5. Glucosamine and chondroitin one tablet a day.   He has hypertension and hemorrhoids.  He is recovering from a recent colitis  secondary to a meal that he shared with friends where they all were  affected.   Past surgeries include tonsillectomy in 1964, hemorrhoidectomy in 1970,  appendectomy in 1968, right knee scope in 1996, again in 1997, left knee  scope in 2004, and __________ of the right great toe in 2004.   FAMILY HISTORY:  Positive for heart disease in the mother as well as  diabetes in mother and father.  Father had colon cancer.   SOCIAL HISTORY:  The patient is married.  He is retired.  Has no intake of  tobacco products, occasional intake of ETOH.  He has no children, and his  wife, Inetta Fermo, will be his major caregiver after surgery.   REVIEW OF SYSTEMS:  CENTRAL NERVOUS SYSTEM:  No seizure, stroke, or  paralysis, numbness, double vision.  RESPIRATORY:  No productive cough, no  hemoptysis, no shortness of breath.  CARDIOVASCULAR:  No chest pain, no  angina or orthopnea.  GASTROINTESTINAL:  No nausea or vomiting, melena or  bloody stool.  The patient has no diarrhea but secondary to the mild  resolving colitis frequent bowel movements during the day.  GENITOURINARY:  No discharge, dysuria, or hematuria.  MUSCULOSKELETAL:  Primarily in present  illness.   PHYSICAL EXAMINATION:  GENERAL:  An alert and cooperative, friendly 53-year-  old white male who is accompanied by his wife.  VITAL SIGNS:  Blood pressure 122/78, pulse 76, respirations are 12.  HEENT:  Normocephalic.  PERRLA.  EOM intact.  Oropharynx is clear.  CHEST:  Clear to auscultation, no rhonchi, no rales.  CARDIAC:  Regular rate and rhythm, no murmurs are heard.  ABDOMEN:  Obese, soft, nontender.  Liver and spleen not felt.  GENITOURINARY, RECTAL:  Not done, not pertinent to the present illness.  EXTREMITIES:  As in present illness above with both knees.    ADMISSION DIAGNOSES:  1. Severe osteoarthritis of the right knee.  2. Hypertension.  3. Resolving colitis.   PLAN:  The patient will be admitted for Osteonics total knee replacement  arthroplasty of the right knee.  Should he have any medical problems, we  will certainly ask Dr. Foye Deer or one of his associates to follow along  with [place] during this patient's hospitalization.     [name].                 Marlowe Kays, M.D.    DLU/MEDQ  D:  10/17/2003  T:  10/17/2003  Job:  [id number]   cc:   Dellis Anes. Idell Pickles, M.D.  [address]  [place]  [place] [address]  Fax: [phone]

## 2010-07-30 NOTE — Cardiovascular Report (Signed)
[name], [name]             ACCOUNT NO.:  [id number]   MEDICAL RECORD NO.:  [id number]          PATIENT TYPE:  OUT   LOCATION:  CATH                         FACILITY:  [facility]   PHYSICIAN:  Elmore Guise., M.D.DATE OF BIRTH:  04-09-1950   DATE OF PROCEDURE:  10/11/2005  DATE OF DISCHARGE:                              CARDIAC CATHETERIZATION   PROCEDURE PERFORMED:  Cardiac catheterization.   CARDIOLOGIST:  Rosine Abe, M.D.   INDICATIONS FOR THE PROCEDURE:  Chest pain with multiple cardiac risk  factors.   DESCRIPTION OF THE PROCEDURE:  The patient was brought to the cardiac cath  lab after appropriate informed consent.  He was prepped and draped in  sterile fashion.  Approximately 20 mL of 1% lidocaine was used for local  anesthesia.  A 6 French sheath was placed in the right femoral artery  without difficulty.   Coronary angiography, LV angiography and limited right femoral angiography  was then performed.  Angio-Seal closure device was deployed without  difficulty.   The patient tolerated the procedure well and was transferred from the  cardiac cath lab in stable condition.   FINDINGS:  Left Main:  The left main is normal.   Left Anterior Descending:  The LAD is normal.  Diagonal-1 normal.   Left Circumflex Artery:  The left circumflex is normal.  OM-1 a small-to-  moderate vessels, normal-appearing.  The OM-2 is normal-appearing.   Right Coronary Artery:  The RCA is a dominant vessel and normal-appearing.   LEFT VENTRICULOGRAPHY:  The left ventriculogram shows an EF of 55-60%.  There are no wall motion abnormalities.  LVEDP is 12 mmHg.   LIMITED RIGHT FEMORAL ANGIOGRAM:  The limited right femoral angiogram shows  no significant disease.  The arteriotomy site is appropriate for Angio-Seal  closure.  The Angio-Seal closure device was deployed without difficulty.   IMPRESSION:  1.  Normal-appearing coronary arteries.  2.  Normal left ventricular systolic  function with an ejection fraction of      55-60%.   PLAN:  Aggressive right factor modification as indicated.      Elmore Guise., M.D.  Electronically Signed     TWK/MEDQ  D:  10/11/2005  T:  10/12/2005  Job:  [id number]   cc:   Dellis Anes. Idell Pickles, M.D.  Fax: [phone]

## 2010-07-30 NOTE — Op Note (Signed)
NAME:  [name], [name]                       ACCOUNT NO.:  [id number]   MEDICAL RECORD NO.:  [id number]                   PATIENT TYPE:  AMB   LOCATION:  DAY                                  FACILITY:  [facility]   PHYSICIAN:  Marlowe Kays, M.D.               DATE OF BIRTH:  February 03, 1951   DATE OF PROCEDURE:  03/22/2002  DATE OF DISCHARGE:                                 OPERATIVE REPORT   PREOPERATIVE DIAGNOSES:  1. Osteoarthritis MP joint right great toe.  2. 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   POSTOPERATIVE DIAGNOSES:  1. Osteoarthritis MP joint right great toe.  2. Torn medial and lateral menisci and medial compartment arthrosis, left     knee.   OPERATION:  1. Cheilectomy MP joint right great toe.  2. Left knee arthroscopy with partial medial and lateral meniscectomy and     shaving of medial femoral condyle.   SURGEON:  Marlowe Kays, M.D.   ASSISTANT:  Nurse.   ANESTHESIA:  General.   PATHOLOGY AND JUSTIFICATION FOR PROCEDURE:  He has had progressive problems  with pain in both the MP joint of his right great toe where x-rays have  demonstrated fairly significant osteoarthritis in his left knee. He has had  prior right knee arthroscopy for torn medial meniscus and at this time his  major pain is medial and an MRI has demonstrated a significant tear of the  posterior horn of the medial meniscus as well as chondromalacia of the knee  joint. He also had significant fraying of the lateral meniscus with minimal  wear in the lateral femoral condyle at surgery. With regard to the right  great toe, it was explained that he might eventually need a MP fusion but  that cheilectomy usually has a very good success rate and this is what I  recommended to him I would try initially.   DESCRIPTION OF PROCEDURE:  Satisfactory general anesthesia, first performed  the surgery on the right foot, pneumatic tourniquet. The leg was  esmarched  out and the right leg was prepped with Duraprep, draped in a sterile field.  A dorsal medial incision with the dorsal sensory nerve protected, capsule  was opened in line with the skin incision and with a combination of sharp  dissection, periosteal elevator supplemented by using a small rongeur as  well as a synovectomy rongeur. I gradually removed numerous osteophytes from  the first metatarsal head and also the base of the proximal phalanx.  Laterally at the base of the proximal phalanx, he actually had several loose  fragments of bone present. At the conclusion of the clean out, he had  excellent passive motion with minimal motion before the clean out. He did  have full thickness wear of the articular cartilage on both the metatarsal  head and the base of the proximal phalanx in numerous areas. The  wound was  irrigated with sterile saline, soft tissue was infiltrated with 0.5% plain  Marcaine. The capsule was then closed with interrupted #1 Vicryl and the  skin and subcutaneous tissue with interrupted 4-0 nylon. Betadine Adaptic  dry sterile dressing were applied, tourniquet was released. The scrub nurse  remained stable while we switched the tourniquet to the left leg, applied a  thigh stabilizer and wrapped the right leg with an Ace wrap. I esmarched out  the left leg and inflated the tourniquet prior to placing the thigh  stabilizer. The left knee was prepped with Duraprep, draped in a sterile  field, superior and medial saline inflow. First through an anterior medial  portal, the lateral compartment of the knee joint was evaluated and had some  modest synovitis present which I resected for visualization purposes. There  was some minimal wear of the lateral femoral condyle. There was significant  fraying of the middle two-thirds of the lateral meniscus along its inner  border for picturing and this was smoothed down with a 3.5 shaver.  With  external rotation/posturing of  his leg, I was unable to get up the lateral  gutter into the suprapatellar area through these portals. I then reversed  portals. Medially he had a good bit of synovitis present which I resected.  He had diffuse wear over most of the medial femoral condyle which I would  estimate at grade 2 to 3 out of 4. This was pictured and debrided out with  baskets and also shaved down until smooth with a 3.5 shaver. The extensive  tear of the medial meniscus noted on the MRI was pictured and went from  basically the mid third all the way into the posterior third. I used the  small baskets and a 3.5 shaver to resect this back to a nice stable rim.  Final pictures were taken. I then looked up in the medial gutter and  suprapatellar area and it had some minimal wear of the patella but nothing  that was arthroscopically treatable. The knee joint was then irrigated until  clear and all fluid possible removed. The two anterior portals were closed  with 4-0 nylon, 20 cc of 0.5% Marcaine with adrenaline was then instilled  through an inflap rasp which was removed and this portal closed with 4-0  nylon as well. Betadine Adaptic dry sterile dressing were applied,  tourniquet was released. The patient tolerated the procedure well and was  taken to the recovery room in satisfactory condition with no known  complications.                                                 Marlowe Kays, M.D.    JA/MEDQ  D:  03/22/2002  T:  03/22/2002  Job:  [id number]

## 2010-07-30 NOTE — Op Note (Signed)
[name], [name]             ACCOUNT NO.:  [id number]   MEDICAL RECORD NO.:  [id number]          PATIENT TYPE:  AMB   LOCATION:  DAY                          FACILITY:  [facility]   PHYSICIAN:  Thornton Park. Daphine Deutscher, MD  DATE OF BIRTH:  Jul 21, 1950   DATE OF PROCEDURE:  03/12/2004  DATE OF DISCHARGE:                                 OPERATIVE REPORT   PREOPERATIVE DIAGNOSES:  Gallbladder sludge and upper abdominal discomfort.   POSTOPERATIVE DIAGNOSES:  Gallbladder sludge and upper abdominal discomfort.   PROCEDURE:  Laparoscopic cholecystectomy with intraoperative cholangiogram.   SURGEON:  Thornton Park. Daphine Deutscher, M.D.   ASSISTANT:  Gabrielle Dare. Janee Morn, M.D.   ANESTHESIA:  General endotracheal.   DESCRIPTION OF PROCEDURE:  [name] was taken to room #1 on December  30 and given general anesthesia.  The abdomen was prepped with Betadine and  draped sterilely.  A longitudinal incision was made down to the umbilicus,  through which the Hasson cannula was passed without difficulty.  The abdomen  was insufflated.   The gallbladder was grasped, elevated and I dissected out cystic duct,  Calot's triangle region.  He had sort of an anterior artery which I clipped  and then divided, and then dissected out cystic duct, put a clip upon the  gallbladder, incised the cystic duct and took a dynamic cholangiogram with a  Reddick catheter. This showed intrahepatic filling, reasonably long cystic  duct with prompt flow into the duodenum.  Cystic duct was then triply  clipped, divided.  Cystic artery was doubly clipped, divided. The  gallbladder was removed from the gallbladder bed with the hook  electrocautery without difficulty.  Hemostasis was achieved as I removed the  gallbladder and no bleeding or bile leaks were noted before separation.  After separation, I cauterized the edge near the top and then brought the  gallbladder out through the umbilicus without difficulty.  Gallbladder was  then  reinspected and irrigated, and again, no bleeding or bile leaks were  noted.   The umbilical port was closed with 0-Vicryl, 2 stitches, under laparoscopic  vision.  The wounds had been injected with Marcaine, the other ports were  withdrawn, and the abdomen was deflated.  The patient seemed to tolerate the  procedure well. The patient was taken to the recovery room in satisfactory  condition.     Matt   MBM/MEDQ  D:  03/12/2004  T:  03/12/2004  Job:  [id number]

## 2010-07-30 NOTE — Op Note (Signed)
NAME:  [name], [name]                       ACCOUNT NO.:  [id number]   MEDICAL RECORD NO.:  [id number]                   PATIENT TYPE:  INP   LOCATION:  0468                                 FACILITY:  [facility]   PHYSICIAN:  Marlowe Kays, M.D.               DATE OF BIRTH:  [birth year]   DATE OF PROCEDURE:  10/28/2003  DATE OF DISCHARGE:                                 OPERATIVE REPORT   PREOPERATIVE DIAGNOSES:  Degenerative arthritis right  knee.   POSTOPERATIVE DIAGNOSES:  Degenerative arthritis right knee.   OPERATION:  Osteonics total knee replacement right.   SURGEON:  Marlowe Kays, M.D.   ASSISTANT:  Madlyn Frankel. Charlann Boxer, M.D.   ANESTHESIA:  General.   PATHOLOGY AND JUSTIFICATION FOR PROCEDURE:  He had close to bone on bone  abutment in the medial compartment of his knee joint with marked  deterioration of the articular surface of the femoral condyle medially and  severe pain.   DESCRIPTION OF PROCEDURE:  Prophylactic antibiotics, satisfactory general  anesthesia, Foley catheter inserted, pneumatic tourniquet, hip positioner,  sure foot, the right leg was prepped with Duraprep from tourniquet to ankle  and draped in a sterile field.  Ioban employed, vertical midline incision  down to the patellar mechanism with a median parapatellar incision to open  the joint. For some reason, this patellar mechanism was quite tight and  initially we could not fully evert the patella so I mobilized it lateral  ward. I did the initial portion of the procedure in this fashion.  Osteophytes were removed from around the femur. I made a 5/16th inch drill  hole in the distal femur, followed by the canal finder and the axis liner  set for 5 degree valgus cut. I took a 10 mm distal femoral cut and then  sized the femur with the cradle guide at a #9. After placing the fixation  holes for the distal femoral cutting jig, I then placed this jig and we made  our anterior and posterior cuts and  posterior and anterior chamfering's. At  this point, I had removed enough bone and freed things up satisfactory  enough that we were able to bend the knee and evert the patella.  The ACL  and PCL were sacrificed and both menisci except for the posterior portions  were removed.  I also undermined the pes anserinus and medial collateral  ligament from the medial tibia.  This portion was actually done prior to  making the femoral cuts.  I then made a leveling cut of the tibia and sized  it at a #9, we then placed a 9 base plate and made our initial drill hole  into the tibia followed by the canal finder and the axis liner and I then  made a 4 mm cut using the tibial cutting jig and measuring off the slightly  depressed medial tibial plateau. I then returned to the femur where the  patella cutting jig and patellar cut made and following this, I then used a  micro saw to remove some bone from the intercondylar notch along with a  rongeur and then used the cutter followed by the impacter to complete the  notchplasty.  I then used the laminar spreader to remove remnants of bone  from the posterior femoral condyles and then we went through a trial  reduction and found that a 10 mm thickness spacer was ideal for both  stability and flexion extension.  While the knee was still in extension, we  then used the 10 mm recessed cutting jig to make a 10 mm recessed cut in the  patella followed by the guide for making three fixation drill holes. I then  placed a patellar button and trimmed bone around it until the button was  pried to the surrounding bone.  I then returned to the tibia with the knee  in extension and using an external rod splitting the bimalleolar distance,  we outlined using the base plate position for the tray on the anterior  tibia. We then stabilized the tray on the anterior tibia with three fixation  pins and I used the tripod apparatus to ream up to a #9 cemented for the  tibial stem.  We then mixed the methylmethacrylate while I water picked the  knee and placed Gelfoam deep in the holes above the femur and tibia. Using a  glue gun, I then individually glued in the three components starting first  with the tibia impacting the compartment tightly and then removing excess  methylmethacrylate. We followed this with a femur and with a 10 mm spacer  held the knee in extension while we glued in the patellar component holding  it with a patellar clamp.  When the methacrylate hardened, we checked and  removed small amounts of remaining methacrylate from the posterior condyles  of the femur and throughout the knee. We did not feel that another trial  reduction was needed since the 10 mm fit so well and the final 10 mm Scorpio  posterior stabilized insert was placed and the knee taken through a full  range of motion with excellent stability.  I placed a Hemovac and closed the  wound over it with interrupted #1 Vicryl in two layers in the quadriceps and  distally in the synovium of the capsule, 2-0 Vicryl in the subcutaneous  tissue and staples in the skin. Betadine Adaptic dry sterile dressing were  applied, tourniquet was released, he was placed in the knee immobilizer and  taken to the recovery room in satisfactory condition with no known  complications. Essentially no blood loss and no blood replacement.                                               Marlowe Kays, M.D.    JA/MEDQ  D:  10/28/2003  T:  10/29/2003  Job:  [id number]

## 2010-10-26 ENCOUNTER — Encounter (INDEPENDENT_AMBULATORY_CARE_PROVIDER_SITE_OTHER): Payer: Self-pay | Admitting: General Surgery

## 2010-10-28 ENCOUNTER — Encounter (INDEPENDENT_AMBULATORY_CARE_PROVIDER_SITE_OTHER): Payer: Self-pay | Admitting: Surgery

## 2010-10-28 ENCOUNTER — Ambulatory Visit (INDEPENDENT_AMBULATORY_CARE_PROVIDER_SITE_OTHER): Payer: 59 | Admitting: Surgery

## 2010-10-28 DIAGNOSIS — K4020 Bilateral inguinal hernia, without obstruction or gangrene, not specified as recurrent: Secondary | ICD-10-CM

## 2010-10-28 NOTE — Progress Notes (Signed)
[name] comes in today for evaluation of a possible right inguinal hernia. I've reviewed his records from Dr. Tenny Craw. 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 Tenny Craw. He has had numerous surgeries particularly in the and shoulder surgery and I have been in the past done a laparoscopic cholecystectomy on him.  He has recently lost about 40 pounds and stays in shape playing a lot of golf.  On physical exam he has bilateral small hernias. Testes are unremarkable. He has a little fatty area around his umbilicus and with cough and Valsalva I do not feel an umbilical hernia. He has noticed some abdominal wall ptosis particularly where he's had previous incisions with scars to the skin and fixing them to the fascia.  These hernias are basically asymptomatic. I told him what to look for in the case of incarceration I would encourage him to continue being active in playing golf. I told him I be happy to see me again whenever needed in the future. Allergies  Allergen Reactions  . Codeine Rash  . Dilaudid (Hydromorphone Hcl) Other (See Comments)    GI upset  . Demerol Other (See Comments)    GI upset   Current Outpatient Prescriptions  Medication Sig Dispense Refill  . allopurinol (ZYLOPRIM) 300 MG tablet Take 300 mg by mouth daily.        Marland Kitchen amoxicillin (AMOXIL) 500 MG capsule Take 500 mg by mouth 3 (three) times daily.        Marland Kitchen aspirin 81 MG tablet Take 81 mg by mouth daily.        . colchicine 0.6 MG tablet Take 0.6 mg by mouth daily.        . ergocalciferol (VITAMIN D2) 50000 UNITS capsule Take 50,000 Units by mouth once a week.        . fish oil-omega-3 fatty acids 1000 MG capsule Take 1 g by mouth daily.        . Glucosamine-Chondroit-Vit C-Mn (GLUCOSAMINE 1500 COMPLEX PO) Take by mouth.        Marland Kitchen HYDROcodone-acetaminophen (VICODIN) 5-500 MG per tablet Take 1 tablet by mouth every 6 (six) hours as needed.        Marland Kitchen  ketoconazole (NIZORAL) 200 MG tablet Take 200 mg by mouth daily.        . magnesium oxide (MAG-OX) 400 MG tablet Take 400 mg by mouth daily.        . Multiple Vitamin (MULTIVITAMIN PO) Take by mouth daily.        . naproxen sodium (ANAPROX) 220 MG tablet Take 220 mg by mouth 1 day or 1 dose.        . olmesartan-hydrochlorothiazide (BENICAR HCT) 40-25 MG per tablet Take 1 tablet by mouth daily.        Marland Kitchen omeprazole (PRILOSEC) 20 MG capsule Take 20 mg by mouth daily.        . simvastatin (ZOCOR) 40 MG tablet Take 40 mg by mouth at bedtime.         Past Surgical History  Procedure Date  . Appendectomy   . Tonsillectomy and adenoidectomy   . Hemorrhoid surgery   . Colon surgery   . Colonoscopy   . Knee arthroplasty     bilateral  . Foot bone excision     bone spurs on left foot  . Cholecystectomy   . Foot surgery     right  .  Cardiac catheterization 2007   Past Medical History  Diagnosis Date  . Hypertension   . Hyperlipidemia   . Osteoarthritis   . Gout   . Low back pain   . Tinea versicolor   . History of colonic polyps   . Hernia   . Hemorrhoids   . Abdominal pain   . Hearing loss   . Sinus problem   . Gout 2009  . Left shoulder pain

## 2010-12-01 LAB — PROTIME-INR
INR: 1.5
INR: 1.5
INR: 1.6 — ABNORMAL HIGH
INR: 1.7 — ABNORMAL HIGH
INR: 1.9 — ABNORMAL HIGH
INR: 2 — ABNORMAL HIGH
Prothrombin Time: 14.3
Prothrombin Time: 22.7 — ABNORMAL HIGH
Prothrombin Time: 23 — ABNORMAL HIGH

## 2010-12-01 LAB — BASIC METABOLIC PANEL
BUN: 10
BUN: 13
BUN: 13
BUN: 14
BUN: 20
CO2: 23
CO2: 24
CO2: 24
CO2: 26
CO2: 27
Calcium: 10.1
Calcium: 8.6
Chloride: 104
Chloride: 105
Chloride: 106
Chloride: 108
Chloride: 99
Creatinine, Ser: 0.94
Creatinine, Ser: 0.97
Creatinine, Ser: 1.15
Creatinine, Ser: 1.18
GFR calc Af Amer: >60
GFR calc Af Amer: >60
GFR calc Af Amer: >60
GFR calc non Af Amer: >60
Glucose, Bld: 111 — ABNORMAL HIGH
Glucose, Bld: 125 — ABNORMAL HIGH
Glucose, Bld: 128 — ABNORMAL HIGH
Glucose, Bld: 149 — ABNORMAL HIGH
Potassium: 3.4 — ABNORMAL LOW
Potassium: 3.6
Potassium: 3.8
Potassium: 4.2
Potassium: 4.4
Sodium: 136
Sodium: 145

## 2010-12-01 LAB — COMPREHENSIVE METABOLIC PANEL
AST: 25
Albumin: 3.4 — ABNORMAL LOW
Alkaline Phosphatase: 50
Chloride: 105
Creatinine, Ser: 1.07
GFR calc Af Amer: >60
Potassium: 3.5
Total Bilirubin: 1.3 — ABNORMAL HIGH
Total Protein: 6.5

## 2010-12-01 LAB — CBC
HCT: 26.6 — ABNORMAL LOW
HCT: 27.1 — ABNORMAL LOW
HCT: 28 — ABNORMAL LOW
Hemoglobin: 9.6 — ABNORMAL LOW
MCHC: 35.4
MCHC: 35.4
MCHC: 35.4
MCHC: 35.4
MCHC: 35.5
MCV: 88.1
MCV: 88.1
MCV: 88.1
MCV: 88.2
Platelets: 179
Platelets: 198
Platelets: 214
Platelets: 224
RBC: 3.08 — ABNORMAL LOW
RBC: 3.18 — ABNORMAL LOW
RDW: 13.3
RDW: 13.4
RDW: 13.6
RDW: 13.8
WBC: 10.8 — ABNORMAL HIGH
WBC: 11.8 — ABNORMAL HIGH
WBC: 7.4
WBC: 8.2

## 2010-12-01 LAB — URINALYSIS, ROUTINE W REFLEX MICROSCOPIC
Glucose, UA: NEGATIVE
pH: 5.5

## 2010-12-01 LAB — ABO/RH: ABO/RH(D): O POS

## 2010-12-01 LAB — APTT: aPTT: 31

## 2010-12-01 LAB — MAGNESIUM: Magnesium: 2.2

## 2010-12-01 LAB — TYPE AND SCREEN: ABO/RH(D): O POS

## 2011-05-20 ENCOUNTER — Ambulatory Visit (INDEPENDENT_AMBULATORY_CARE_PROVIDER_SITE_OTHER): Payer: 59 | Admitting: Surgery

## 2011-05-20 ENCOUNTER — Encounter (INDEPENDENT_AMBULATORY_CARE_PROVIDER_SITE_OTHER): Payer: Self-pay | Admitting: Surgery

## 2011-05-20 VITALS — BP 124/76 | HR 64 | Temp 98.4°F | Resp 16 | Ht 74.0 in | Wt 211.0 lb

## 2011-05-20 DIAGNOSIS — D3613 Benign neoplasm of peripheral nerves and autonomic nervous system of lower limb, including hip: Secondary | ICD-10-CM | POA: Insufficient documentation

## 2011-05-20 DIAGNOSIS — K644 Residual hemorrhoidal skin tags: Secondary | ICD-10-CM

## 2011-05-20 DIAGNOSIS — K921 Melena: Secondary | ICD-10-CM

## 2011-05-20 DIAGNOSIS — Z9889 Other specified postprocedural states: Secondary | ICD-10-CM | POA: Insufficient documentation

## 2011-05-20 NOTE — Progress Notes (Signed)
[name] comes in today he has had quite a 2-3 week period time getting was some rectal bleeding. Between Dr. Tenny Craw and Dr. Laural Benes he is had a colonoscopy which was normal. He does have family history of colon cancer and has these regularly. After the colonoscopy he did have a repeat in some rectal bleeding. I think what seems to help him the most his manual reduction and pressure to control and control bleeding the has after defecation. On exam, I can feel no internal hemorrhoids or fissures.  I didn't want to instrument him today as that would likely stir up the bleeding.  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  Would observe.  No surgery.   He knows about hygiene uses tucks. I'll be happy to see him again whenever needed.  He also has hernias which are no is not bothering him. We'll continue to observe. Return p.r.n.

## 2012-09-07 ENCOUNTER — Ambulatory Visit (INDEPENDENT_AMBULATORY_CARE_PROVIDER_SITE_OTHER): Payer: 59 | Admitting: Surgery

## 2012-09-07 ENCOUNTER — Encounter (INDEPENDENT_AMBULATORY_CARE_PROVIDER_SITE_OTHER): Payer: Self-pay | Admitting: Surgery

## 2012-09-07 ENCOUNTER — Other Ambulatory Visit (INDEPENDENT_AMBULATORY_CARE_PROVIDER_SITE_OTHER): Payer: Self-pay

## 2012-09-07 VITALS — BP 110/64 | HR 62 | Temp 99.1°F | Resp 15 | Ht 74.0 in | Wt 209.2 lb

## 2012-09-07 DIAGNOSIS — M109 Gout, unspecified: Secondary | ICD-10-CM | POA: Insufficient documentation

## 2012-09-07 DIAGNOSIS — K4090 Unilateral inguinal hernia, without obstruction or gangrene, not specified as recurrent: Secondary | ICD-10-CM

## 2012-09-07 DIAGNOSIS — M1990 Unspecified osteoarthritis, unspecified site: Secondary | ICD-10-CM | POA: Insufficient documentation

## 2012-09-07 NOTE — Patient Instructions (Signed)
Simvastatin tablets What is this medicine? SIMVASTATIN (SIM va stat in) is known as a HMG-CoA reductase inhibitor or 'statin'. It lowers the level of cholesterol and triglycerides in the blood. This drug may also reduce the risk of heart attack, stroke, or other health problems in patients with risk factors for heart or blood vessel disease. Diet and lifestyle changes are often used with this drug. This medicine may be used for other purposes; ask your health care provider or pharmacist if you have questions. What should I tell my health care provider before I take this medicine? They need to know if you have any of these conditions: -frequently drink alcoholic beverages -kidney disease -liver disease -muscle aches or weakness -other medical condition -an unusual or allergic reaction to simvastatin, other medicines, foods, dyes, or preservatives -pregnant or trying to get pregnant -breast-feeding How should I use this medicine? Take this medicine by mouth with a glass of water. Follow the directions on the prescription label. You can take this medicine with or without food. Take your doses at regular intervals. Do not take your medicine more often than directed. Talk to your pediatrician regarding the use of this medicine in children. Special care may be needed. Overdosage: If you think you have taken too much of this medicine contact a poison control center or emergency room at once. NOTE: This medicine is only for you. Do not share this medicine with others. What if I miss a dose? If you miss a dose, take it as soon as you can. If it is almost time for your next dose, take only that dose. Do not take double or extra doses. What may interact with this medicine? Do not take this medicine with any of the following medications: -boceprevir -cyclosporine -danazol -gemfibrozil -medicines for fungal infections like itraconazole, ketoconazole, posaconazole -medicines for HIV  infection -mifepristone, RU-486 -nefazodone -red yeast rice -some antibiotics like clarithromycin, erythromycin, telithromycin -telaprevir This medicine may also interact with the following medications: -alcohol -amiodarone -amlodipine -colchicine -digoxin -diltiazem -fluconazole -grapefruit juice -niacin -ranolazine -verapamil -voriconazole -warfarin This list may not describe all possible interactions. Give your health care provider a list of all the medicines, herbs, non-prescription drugs, or dietary supplements you use. Also tell them if you smoke, drink alcohol, or use illegal drugs. Some items may interact with your medicine. What should I watch for while using this medicine? Visit your doctor or health care professional for regular check-ups. You may need regular tests to make sure your liver is working properly. Tell you doctor or health care professional right away if you get any unexplained muscle pain, tenderness, or weakness, especially if you also have a fever and tiredness. This medicine may affect blood sugar levels. If you have diabetes, check with your doctor or health care professional before you change your diet or the dose of your diabetic medicine. This drug is only part of a total heart-health program. Your doctor or a dietician can suggest a low-cholesterol and low-fat diet to help. Avoid alcohol and smoking, and keep a proper exercise schedule. Do not use this drug if you are pregnant or breast-feeding. Serious side effects to an unborn child or to an infant are possible. Talk to your doctor or pharmacist for more information. If you are going to have surgery tell your health care professional that you are taking this drug. Some drugs may increase the risk of side effects from simvastatin. If you are given certain antibiotics or antifungals, your doctor or health care professional  may stop simvastatin for a short time. Check with your doctor or pharmacist for  advice. What side effects may I notice from receiving this medicine? Side effects that you should report to your doctor or health care professional as soon as possible: -allergic reactions like skin rash, itching or hives, swelling of the face, lips, or tongue -confusion -dark urine -fever -joint pain -loss of memory -muscle cramps, pain -redness, blistering, peeling or loosening of the skin, including inside the mouth -trouble passing urine or change in the amount of urine -unusually weak or tired -yellowing of the eyes or skin Side effects that usually do not require medical attention (report to your doctor or health care professional if they continue or are bothersome): -constipation -heartburn -stomach gas, pain, upset This list may not describe all possible side effects. Call your doctor for medical advice about side effects. You may report side effects to FDA at 1-800-FDA-1088. Where should I keep my medicine? Keep out of the reach of children. Store at room temperature below 30 degrees C (86 degrees F). Keep container tightly closed. Throw away any unused medicine after the expiration date. NOTE: This sheet is a summary. It may not cover all possible information. If you have questions about this medicine, talk to your doctor, pharmacist, or health care provider.  2012, Elsevier/Gold Standard. (07/21/2010 3:25:06 PM)

## 2012-09-07 NOTE — Progress Notes (Signed)
[name] 62 y.o.  Body mass index is 26.85 kg/(m^2).  Patient Active Problem List   Diagnosis Date Noted  . Arthritis 09/07/2012  . Gout 09/07/2012  . Inguinal hernia 09/07/2012  . History of hemorrhoidectomy 05/20/2011  . Neuroma of foot     Allergies  Allergen Reactions  . Codeine Rash  . Dilaudid (Hydromorphone Hcl) Other (See Comments)    GI upset  . Demerol Other (See Comments)    GI upset    Past Surgical History  Procedure Laterality Date  . Appendectomy    . Tonsillectomy and adenoidectomy    . Hemorrhoid surgery    . Colon surgery    . Colonoscopy    . Knee arthroplasty      bilateral  . Foot bone excision      bone spurs on left foot  . Cholecystectomy    . Foot surgery      right  . Cardiac catheterization  2007   [name], MD No diagnosis found.  Has been having diarrhea issues and some right groin pain at site of hernia.  Nothing incarcerated.  Prior dx of bilateral herniae-don't know what they contain--ie fat vs bowel.  Will get CT abdomen/pelvis to assess.  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   Return after CT scan.   Matt B. Daphine Deutscher, MD, [facility] [phone] beeper [phone]  09/07/2012 11:39 AM

## 2012-09-11 ENCOUNTER — Ambulatory Visit
Admission: RE | Admit: 2012-09-11 | Discharge: 2012-09-11 | Disposition: A | Discharge status: Home / Self Care | Payer: 59 | Source: Ambulatory Visit | Attending: Surgery | Admitting: Surgery

## 2012-09-11 DIAGNOSIS — K4090 Unilateral inguinal hernia, without obstruction or gangrene, not specified as recurrent: Secondary | ICD-10-CM

## 2012-09-11 MED ORDER — IOHEXOL 300 MG/ML  SOLN
125.0000 mL | Freq: Once | INTRAMUSCULAR | Status: AC | PRN
  Administered 2012-09-11: 125 mL via INTRAVENOUS

## 2012-09-12 ENCOUNTER — Ambulatory Visit (INDEPENDENT_AMBULATORY_CARE_PROVIDER_SITE_OTHER): Payer: 59 | Admitting: Surgery

## 2012-09-12 ENCOUNTER — Encounter (INDEPENDENT_AMBULATORY_CARE_PROVIDER_SITE_OTHER): Payer: Self-pay | Admitting: Surgery

## 2012-09-12 VITALS — BP 102/66 | HR 86 | Temp 97.6°F | Resp 16 | Ht 74.0 in | Wt 206.6 lb

## 2012-09-12 DIAGNOSIS — K4090 Unilateral inguinal hernia, without obstruction or gangrene, not specified as recurrent: Secondary | ICD-10-CM

## 2012-09-12 NOTE — Patient Instructions (Signed)
Thanks for your patience.  If you need further assistance after leaving the office, please call our office and speak with a [facility] nurse.  [phone].  If you want to leave a message for Dr. Karmin Kasprzak, please call his office phone at [phone]. 

## 2012-09-12 NOTE — Progress Notes (Signed)
[name] 62 y.o.  Body mass index is 26.51 kg/(m^2).  Patient Active Problem List   Diagnosis Date Noted  . Arthritis 09/07/2012  . Gout 09/07/2012  . Inguinal hernia 09/07/2012  . History of hemorrhoidectomy 05/20/2011  . Neuroma of foot     Allergies  Allergen Reactions  . Codeine Rash  . Dilaudid (Hydromorphone Hcl) Other (See Comments)    GI upset  . Demerol Other (See Comments)    GI upset    Past Surgical History  Procedure Laterality Date  . Appendectomy    . Tonsillectomy and adenoidectomy    . Hemorrhoid surgery    . Colon surgery    . Colonoscopy    . Knee arthroplasty      bilateral  . Foot bone excision      bone spurs on left foot  . Cholecystectomy    . Foot surgery      right  . Cardiac catheterization  2007   [name], MD 1. Inguinal hernia     CT results shared with patient.  Fat in inguinal canals.  Would not recommend any surgical intervention at this time.  Would intervene if symptomatic Matt B. Daphine Deutscher, MD, [facility] [phone] beeper [phone]  09/12/2012 9:26 AM

## 2013-01-17 ENCOUNTER — Other Ambulatory Visit: Payer: Self-pay

## 2015-11-04 ENCOUNTER — Other Ambulatory Visit: Payer: Self-pay | Admitting: Family Medicine

## 2015-11-04 DIAGNOSIS — R1013 Epigastric pain: Secondary | ICD-10-CM

## 2015-11-04 DIAGNOSIS — R1084 Generalized abdominal pain: Secondary | ICD-10-CM

## 2015-11-07 ENCOUNTER — Other Ambulatory Visit: Payer: Self-pay | Admitting: Family Medicine

## 2015-11-07 DIAGNOSIS — R1013 Epigastric pain: Secondary | ICD-10-CM

## 2015-11-07 DIAGNOSIS — R1084 Generalized abdominal pain: Secondary | ICD-10-CM

## 2015-11-07 DIAGNOSIS — R1011 Right upper quadrant pain: Secondary | ICD-10-CM

## 2015-11-18 ENCOUNTER — Ambulatory Visit
Admission: RE | Admit: 2015-11-18 | Discharge: 2015-11-18 | Disposition: A | Discharge status: Home / Self Care | Payer: Medicare Other | Source: Ambulatory Visit | Attending: Family Medicine | Admitting: Family Medicine

## 2015-11-18 DIAGNOSIS — R1011 Right upper quadrant pain: Secondary | ICD-10-CM

## 2015-11-18 DIAGNOSIS — R1084 Generalized abdominal pain: Secondary | ICD-10-CM

## 2015-11-18 DIAGNOSIS — R1013 Epigastric pain: Secondary | ICD-10-CM

## 2016-04-11 ENCOUNTER — Encounter (INDEPENDENT_AMBULATORY_CARE_PROVIDER_SITE_OTHER): Payer: Medicare Other | Admitting: Ophthalmology

## 2016-04-11 DIAGNOSIS — H353121 Nonexudative age-related macular degeneration, left eye, early dry stage: Secondary | ICD-10-CM | POA: Diagnosis not present

## 2016-04-11 DIAGNOSIS — H2513 Age-related nuclear cataract, bilateral: Secondary | ICD-10-CM | POA: Diagnosis not present

## 2016-04-11 DIAGNOSIS — H43813 Vitreous degeneration, bilateral: Secondary | ICD-10-CM | POA: Diagnosis not present

## 2016-05-03 ENCOUNTER — Other Ambulatory Visit: Payer: Self-pay | Admitting: Orthopedic Surgery

## 2016-05-03 DIAGNOSIS — M542 Cervicalgia: Secondary | ICD-10-CM

## 2016-05-04 ENCOUNTER — Other Ambulatory Visit: Payer: Self-pay | Admitting: Dermatology

## 2016-05-11 ENCOUNTER — Ambulatory Visit
Admission: RE | Admit: 2016-05-11 | Discharge: 2016-05-11 | Disposition: A | Discharge status: Home / Self Care | Payer: Medicare Other | Source: Ambulatory Visit | Attending: Orthopedic Surgery | Admitting: Orthopedic Surgery

## 2016-05-11 DIAGNOSIS — M542 Cervicalgia: Secondary | ICD-10-CM

## 2016-06-10 ENCOUNTER — Emergency Department (HOSPITAL_COMMUNITY): Payer: Medicare Other

## 2016-06-10 ENCOUNTER — Encounter (HOSPITAL_COMMUNITY): Payer: Self-pay | Admitting: *Deleted

## 2016-06-10 ENCOUNTER — Emergency Department (HOSPITAL_COMMUNITY)
Admission: EM | Admit: 2016-06-10 | Discharge: 2016-06-10 | Disposition: A | Discharge status: Home / Self Care | Payer: Medicare Other | Attending: Emergency Medicine | Admitting: Emergency Medicine

## 2016-06-10 DIAGNOSIS — G8918 Other acute postprocedural pain: Secondary | ICD-10-CM | POA: Insufficient documentation

## 2016-06-10 DIAGNOSIS — M542 Cervicalgia: Secondary | ICD-10-CM | POA: Diagnosis not present

## 2016-06-10 DIAGNOSIS — Z96653 Presence of artificial knee joint, bilateral: Secondary | ICD-10-CM | POA: Insufficient documentation

## 2016-06-10 DIAGNOSIS — I10 Essential (primary) hypertension: Secondary | ICD-10-CM

## 2016-06-10 LAB — I-STAT CHEM 8, ED
BUN: 22 mg/dL — AB (ref 6–20)
CALCIUM ION: 1.04 mmol/L — AB (ref 1.15–1.40)
CHLORIDE: 102 mmol/L (ref 101–111)
Creatinine, Ser: 0.8 mg/dL (ref 0.61–1.24)
Glucose, Bld: 112 mg/dL — ABNORMAL HIGH (ref 65–99)
HCT: 38 % — ABNORMAL LOW (ref 39.0–52.0)
Hemoglobin: 12.9 g/dL — ABNORMAL LOW (ref 13.0–17.0)
POTASSIUM: 3.8 mmol/L (ref 3.5–5.1)
SODIUM: 136 mmol/L (ref 135–145)
TCO2: 21 mmol/L (ref 0–100)

## 2016-06-10 LAB — I-STAT TROPONIN, ED: Troponin i, poc: 0.01 ng/mL (ref 0.00–0.08)

## 2016-06-10 MED ORDER — OXYCODONE-ACETAMINOPHEN 5-325 MG PO TABS: 1.0000 | ORAL_TABLET | Freq: Three times a day (TID) | ORAL | 0 refills | Status: DC | PRN

## 2016-06-10 MED ORDER — OXYCODONE-ACETAMINOPHEN 5-325 MG PO TABS
2.0000 | ORAL_TABLET | Freq: Once | ORAL | Status: AC
  Administered 2016-06-10: 2 via ORAL
  Filled 2016-06-10: qty 2

## 2016-06-10 NOTE — ED Provider Notes (Signed)
[place] DEPT Provider Note   CSN: [id number] Arrival date & time: 06/10/16  9373     History   Chief Complaint Chief Complaint  Patient presents with  . Hypertension    HPI [name] is a 66 y.o. male.  The history is provided by the patient and the spouse.  Hypertension  This is a chronic problem. The problem occurs daily. The problem has been gradually worsening. Pertinent negatives include no abdominal pain and no shortness of breath. Associated symptoms comments: Indigestion . Exacerbated by: post operative pain. Nothing relieves the symptoms.  patient is here for multiple complaints  1. Patient had cervical spine surgery on 3/14 by dr Sherwood Gambler The surgery was uneventful, without acute complications. However he has noted since surgery his BP continues to elevate despite HTN meds.  He reports he checks it multiple times/day and it keeps going up He reports he still has pain from the surgery and this may be contributing to BP He also reports brief episodes of CP/indigestion that he has had previously.  2. Pt also reports scattered rash to right UE of unclear etiology  3. - he also reports LE swelling- unclear etiology but reports he is sleeping in a recliner.  4. He reports aching in his arms after the surgery and over past several days he has had increased weakness in left UE which is new and not present prior to surgery No new LE weakness  He denies known h/o CAD/CVA  He reports calling his neurosurgeon multiple times this past week He also reports calling insurance helpline and they told him to go to the ER    Past Medical History:  Diagnosis Date  . Abdominal pain   . GERD (gastroesophageal reflux disease)   . Gout   . Gout 2009  . Hearing loss   . Hemorrhoids   . Hernia   . History of colonic polyps   . Hyperlipidemia   . Hypertension   . Left shoulder pain   . Low back pain   . Neuroma of foot    bilateral  . Osteoarthritis   . Sinus  problem   . Tinea versicolor     Patient Active Problem List   Diagnosis Date Noted  . Arthritis 09/07/2012  . Gout 09/07/2012  . Inguinal hernia 09/07/2012  . History of hemorrhoidectomy 05/20/2011  . Neuroma of foot     Past Surgical History:  Procedure Laterality Date  . APPENDECTOMY    . CARDIAC CATHETERIZATION  2007  . CHOLECYSTECTOMY    . COLON SURGERY    . COLONOSCOPY    . FOOT BONE EXCISION     bone spurs on left foot  . FOOT SURGERY     right  . HEMORRHOID SURGERY    . KNEE ARTHROPLASTY     bilateral  . TONSILLECTOMY AND ADENOIDECTOMY         Home Medications    Prior to Admission medications   Medication Sig Start Date End Date Taking? Authorizing Provider  acetaminophen (TYLENOL) 500 MG tablet Take 500 mg by mouth every 6 (six) hours as needed for pain.    Historical Provider, MD  allopurinol (ZYLOPRIM) 300 MG tablet Take 300 mg by mouth daily.      Historical Provider, MD  colchicine 0.6 MG tablet Take 0.6 mg by mouth daily.      Historical Provider, MD  ergocalciferol (VITAMIN D2) 50000 UNITS capsule Take 50,000 Units by mouth once a week.  Historical Provider, MD  fish oil-omega-3 fatty acids 1000 MG capsule Take 1 g by mouth daily.      Historical Provider, MD  Glucosamine-Chondroit-Vit C-Mn (GLUCOSAMINE 1500 COMPLEX PO) Take by mouth.      Historical Provider, MD  magnesium oxide (MAG-OX) 400 MG tablet Take 400 mg by mouth daily.      Historical Provider, MD  Multiple Vitamin (MULTIVITAMIN PO) Take by mouth daily.      Historical Provider, MD  olmesartan-hydrochlorothiazide (BENICAR HCT) 40-25 MG per tablet Take 1 tablet by mouth daily.      Historical Provider, MD  omeprazole (PRILOSEC) 20 MG capsule Take 20 mg by mouth daily.      Historical Provider, MD  simvastatin (ZOCOR) 40 MG tablet Take 40 mg by mouth at bedtime.      Historical Provider, MD    Family History Family History  Problem Relation Age of Onset  . Diabetes Mother   . Heart  disease Mother   . Other Mother     dementia  . Cancer Father     colon  . Heart disease Father     heart attack  . Diabetes Father   . Diabetes Sister   . Hypertension Sister   . Hypertension Sister   . Diabetes Sister   . COPD Brother   . Heart disease Brother   . Other Brother     dementia    Social History Social History  Substance Use Topics  . Smoking status: Never Smoker  . Smokeless tobacco: Never Used  . Alcohol use No     Allergies   Codeine; Dilaudid [hydromorphone hcl]; and Demerol   Review of Systems Review of Systems  Constitutional: Negative for fever.  Respiratory: Negative for shortness of breath.   Cardiovascular:       Indigestion   Gastrointestinal: Negative for abdominal pain.  Genitourinary:       Mild dysuria post op   Musculoskeletal: Positive for neck pain.  Neurological: Positive for weakness.  All other systems reviewed and are negative.    Physical Exam Updated Vital Signs BP (!) 173/122 (BP Location: Right Arm)   Pulse 93   Temp 98.2 F (36.8 C) (Oral)   Resp 16   SpO2 97%   Physical Exam CONSTITUTIONAL: Well developed/well nourished HEAD: Normocephalic/atraumatic EYES: EOMI/PERRL ENMT: Mucous membranes moist NECK: c-collar in place.  Anterior neck incision is clean/dry/intact SPINE/BACK:entire spine nontender CV: S1/S2 noted, no murmurs/rubs/gallops noted LUNGS: Lungs are clear to auscultation bilaterally, no apparent distress ABDOMEN: soft, nontender, no rebound or guarding, bowel sounds noted throughout abdomen GU:no cva tenderness NEURO: Pt is awake/alert/appropriate, moves all extremitiesx4.  No facial droop.   Left UE - weakness noted with grip and also with left elbow flexion and weakness noted with left shoulder movement.   No other focal weakness noted in his extremities EXTREMITIES: pulses normal/equal, full ROM SKIN: warm, color normal PSYCH: no abnormalities of mood noted, alert and oriented to  situation   ED Treatments / Results  Labs (all labs ordered are listed, but only abnormal results are displayed) Labs Reviewed  I-STAT CHEM 8, ED - Abnormal; Notable for the following:       Result Value   BUN 22 (*)    Glucose, Bld 112 (*)    Calcium, Ion 1.04 (*)    Hemoglobin 12.9 (*)    HCT 38.0 (*)    All other components within normal limits  I-STAT TROPOININ, ED    EKG  EKG Interpretation  Date/Time:  Friday June 10 2016 85:46:27 EDT Ventricular Rate:  94 PR Interval:    QRS Duration: 110 QT Interval:  382 QTC Calculation: 478 R Axis:   43 Text Interpretation:  Sinus rhythm Atrial premature complex Probable left atrial enlargement Probable lateral infarct, old Interpretation limited secondary to artifact Confirmed by Christy Gentles  MD, Samariyah Cowles ([id number]) on 06/10/2016 6:36:43 AM       Radiology Dg Chest 2 View  Result Date: 06/10/2016 CLINICAL DATA:  Chest pain EXAM: CHEST  2 VIEW COMPARISON:  05/25/2016 FINDINGS: The heart size and mediastinal contours are within normal limits. Both lungs are clear. The visualized skeletal structures are unremarkable. IMPRESSION: No active cardiopulmonary disease. Electronically Signed   By: Franchot Gallo M.D.   On: 06/10/2016 07:01    Procedures Procedures (including critical care time)  Medications Ordered in ED Medications  oxyCODONE-acetaminophen (PERCOCET/ROXICET) 5-325 MG per tablet 2 tablet (2 tablets Oral Given 06/10/16 9381)     Initial Impression / Assessment and Plan / ED Course  I have reviewed the triage vital signs and the nursing notes.  Pertinent labs & imaging results that were available during my care of the patient were reviewed by me and considered in my medical decision making (see chart for details).     6:56 AM Pt here for multiple issues post op He had cervical fusion earlier this month  For BP - plan to treat pain, and will obtain CXR/EKG/labs I suspect this is due to postop pain  he is overall well  appearing/nontoxic, I doubt CVA/dissection or other acute emergency at this time   As for his weakness - I spoke to on call for neurosurgery ([place]) Postop from cervical fusion, patients often have neuro symptoms in the post operative period.  This aligns with the dermatomes that were fused. Patients usually benefit from physical therapy and patient can call office to have this arranged.  No acute intervention required at this time   8:25 AM Workup reassuring Pt well appearing He does not appear to have acute hypertensive emergency He reports pain improved with percocet  Plan: Stop vicodin, start percocet for now as he has better pain relief and will help his HTN  Check BP 1-2 times daily, keep record and call PCP next week If he has new onset HA/weakness/CP he should return for ER evaluation  Call neurosurgeon for followup and physical therapy  Pt/family agreeable with plan   Final Clinical Impressions(s) / ED Diagnoses   Final diagnoses:  Essential hypertension  Post-op pain    New Prescriptions New Prescriptions   OXYCODONE-ACETAMINOPHEN (PERCOCET/ROXICET) 5-325 MG TABLET    Take 1 tablet by mouth every 8 (eight) hours as needed for severe pain.     Ripley Fraise, MD 06/10/16 [phone]

## 2016-06-10 NOTE — ED Triage Notes (Signed)
Pt had a cervical surgery two weeks ago. Since surgery, pt has had worsening hypertension. Pt has hx of htn and takes losartan. This morning pt had multiple readings 200/120, called their insurance company's nurse hotline, and was sent to ED for further eval. Pt denies dizziness, has been having BLE edema. Dr.Nudelman is pt's neurosurgeon

## 2016-06-10 NOTE — ED Notes (Signed)
This RN drew lavender and light green lab tubes as save tubes, stored in mini lab.

## 2016-06-13 ENCOUNTER — Other Ambulatory Visit: Payer: Self-pay | Admitting: Neurosurgery

## 2016-06-13 DIAGNOSIS — M5412 Radiculopathy, cervical region: Secondary | ICD-10-CM

## 2016-06-14 ENCOUNTER — Ambulatory Visit
Admission: RE | Admit: 2016-06-14 | Discharge: 2016-06-14 | Disposition: A | Discharge status: Home / Self Care | Payer: Medicare Other | Source: Ambulatory Visit | Attending: Neurosurgery | Admitting: Neurosurgery

## 2016-06-14 DIAGNOSIS — M5412 Radiculopathy, cervical region: Secondary | ICD-10-CM

## 2019-04-23 ENCOUNTER — Ambulatory Visit: Payer: Medicare Other | Attending: Internal Medicine

## 2019-04-23 DIAGNOSIS — Z23 Encounter for immunization: Secondary | ICD-10-CM | POA: Insufficient documentation

## 2019-04-23 NOTE — Progress Notes (Signed)
   Covid-19 Vaccination Clinic  Name:  [name]    MRN: [id number] DOB: 1950-05-12  04/23/2019  [name] was observed post Covid-19 immunization for 15 minutes without incidence. He was provided with Vaccine Information Sheet and instruction to access the V-Safe system.   [name] was instructed to call 911 with any severe reactions post vaccine: Marland Kitchen Difficulty breathing  . Swelling of your face and throat  . A fast heartbeat  . A bad rash all over your body  . Dizziness and weakness    Immunizations Administered    Name Date Dose VIS Date Route   Pfizer COVID-19 Vaccine 04/23/2019  3:32 PM 0.3 mL 02/22/2019 Intramuscular   Manufacturer: [place]   Lot: [place] [id number]   [place]: [id number]

## 2019-04-25 ENCOUNTER — Ambulatory Visit: Payer: Medicare Other

## 2019-05-18 ENCOUNTER — Ambulatory Visit: Payer: Medicare Other | Attending: Internal Medicine

## 2019-05-18 DIAGNOSIS — Z23 Encounter for immunization: Secondary | ICD-10-CM | POA: Insufficient documentation

## 2019-05-18 NOTE — Progress Notes (Signed)
   Covid-19 Vaccination Clinic  Name:  [name]    MRN: [id number] DOB: 1950-07-02  05/18/2019  [name] was observed post Covid-19 immunization for 15 minutes without incident. He was provided with Vaccine Information Sheet and instruction to access the V-Safe system.   [name] was instructed to call 911 with any severe reactions post vaccine: Marland Kitchen Difficulty breathing  . Swelling of face and throat  . A fast heartbeat  . A bad rash all over body  . Dizziness and weakness   Immunizations Administered    Name Date Dose VIS Date Route   Pfizer COVID-19 Vaccine 05/18/2019 11:15 AM 0.3 mL 02/22/2019 Intramuscular   Manufacturer: [place]   Lot: [id number]   [place]: [id number]

## 2019-10-08 ENCOUNTER — Ambulatory Visit: Payer: Self-pay | Admitting: *Deleted

## 2019-10-08 NOTE — Telephone Encounter (Signed)
   Reason for Disposition . Health Information question, no triage required and triager able to answer question  Answer Assessment - Initial Assessment Questions 1. REASON FOR CALL or QUESTION: "What is your reason for calling today?" or "How can I best help you?" or "What question do you have that I can help answer?"     I have [employer].  I go to [employer] and they won't call me back. I'm having a sinus problem getting worse.  Should I get a COVID-19 test?  I had a antibody test and it was negative.  Now I'm coughing and having headaches.  I'm using Tylenol. And Flonase nasal spray.  I've had sinus problems for years.  My temperature slightly low as my normal.  My 98.9 is my temperature.   Decreased appetite.  I'm having diarrhea and chills.   I called [facility] Monday morning to make an appt with Dr. Harrington Challenger.   They are not seeing anyone in the office if they are sick.  They made me an appt for tomorrow morning (7/282021) as a virtual visit with Dr. Harrington Challenger.  I'm just wondering if that's ok and why they won't see me if I'm sick.   Should I be tested for COVID-19?  I reassured him and told him why his doctor's office is doing the virtual visit due to his symptoms it did not mean he has COVID or should go immediately be tested.   It's a precautionary measure.  He verbalized understanding and appreciated me explaining why things were being done the way they are.    I instructed him to keep his appt tomorrow with his PCP but if he is getting worse and feels he needs to be seen before tomorrow morning to go to the Urgent Care.   He was agreeable to this.   "I was just frustrated because I couldn't talk with anyone at the [place] office to get answers to my questions".    I'll keep my virtual appt tomorrow.  Protocols used: INFORMATION ONLY CALL - NO TRIAGE-A-AH

## 2020-04-07 HISTORY — PX: COLONOSCOPY: SHX174

## 2020-04-07 HISTORY — PX: UPPER GI ENDOSCOPY: SHX6162

## 2020-05-18 ENCOUNTER — Ambulatory Visit: Payer: Self-pay | Admitting: Surgery

## 2020-05-18 NOTE — H&P (Signed)
[name] Appointment: 05/18/2020 9:30 AM Location: [facility] Patient #: [id number] DOB: [birth year] Married / Language: English / Race: White Male  History of Present Illness Adin Hector MD; 05/18/2020 12:38 PM) The patient is a 70 year old male who presents with a colorectal polyp. Note for "Colorectal polyp": ` ` ` Patient sent for surgical consultation at the request of Parag Brahmbhatt, [facility]  Chief Complaint: Hemorrhoids and very distal adenomatous polyp. ` ` The patient is a 70 year old male. Underwent colonoscopy. Diverticulosis polyps noted. Removed. Very distal rectal polyp mass biopsied as well. Systems adenomatous polyp. Significant hemorrhoids. Surgical consultation recommended. The patient retired from [employer]. He's had a lot of colonoscopies because his father was diagnosed with colon cancer. On last colonoscopy made to start a polyp was noted. He recalls some of the discussion that those questions. He usually moves his bowels a couple times a day in the morning. The next review. He had a cholecystectomy but no other abdominal surgery.  The joint surgery. No history of stroke or heart attack. Not on blood thinners. No diabetes. Does not smoke. No sleep apnea. Has some heartburn and reflux but otherwise no major GI issues. Does not recall any severe pain or rectal bleeding. He's had prolapsing hemorrhoids or external hemorrhoids for some time. He had had hemorrhoid surgery when he was 78. I think he was discouraged from considering hemorrhoid surgery again about a decade ago. Notes are conflicting.  (Review of systems as stated in this history (HPI) or in the review of systems. Otherwise all other 12 point ROS are negative) ` ` ###########################################`  This patient encounter took 30 minutes today to perform the following: obtain history, perform exam, review outside records, interpret tests & imaging, counsel  the patient on their diagnosis; and, document this encounter, including findings & plan in the electronic health record (EHR).   Past Surgical History ([name], [place]; 05/18/2020 9:17 AM) Appendectomy Colon Polyp Removal - Colonoscopy Colon Polyp Removal - Open Foot Surgery Bilateral. Gallbladder Surgery - Open Hemorrhoidectomy Knee Surgery Bilateral. Spinal Surgery - Neck Tonsillectomy  Diagnostic Studies History ([name], CMA; 05/18/2020 9:17 AM) Colonoscopy within last year  Allergies ([name], CMA; 05/18/2020 9:18 AM) Codeine Phosphate *ANALGESICS - OPIOID* Demerol *ANALGESICS - OPIOID* Dilaudid *ANALGESICS - OPIOID* Shingrix *VACCINES* Pneumovax 23 *VACCINES* Allergies Reconciled  Medication History ([name], CMA; 05/18/2020 9:18 AM) Omeprazole (20MG  Capsule DR, Oral) Active. Amoxicillin (500MG  Capsule, Oral) Active. Metoprolol Tartrate (50MG  Tablet, Oral) Active. Losartan Potassium (100MG  Tablet, Oral) Active. Simvastatin (40MG  Tablet, Oral) Active. Gabapentin (300MG  Capsule, Oral) Active. Medications Reconciled  Social History [name], CMA; 05/18/2020 9:17 AM) Alcohol use Occasional alcohol use. Caffeine use Carbonated beverages, Coffee, Tea. No drug use Tobacco use Never smoker.  Family History [name], [name]; 05/18/2020 9:17 AM) Arthritis Brother, Mother, Sister. Colon Cancer Father. Colon Polyps Brother, Father. Depression Sister. Diabetes Mellitus Father, Mother, Sister. Heart Disease Mother. Hypertension Sister. Kidney Disease Family Members In General.  Other Problems [name], [place]; 05/18/2020 9:17 AM) Arthritis Back Pain Bladder Problems Diverticulosis Gastroesophageal Reflux Disease Hemorrhoids High blood pressure Hypercholesterolemia     Review of Systems ([name]; 05/18/2020 9:17 AM) General Present- Chills and Night Sweats. Not Present- Appetite Loss, Fatigue, Fever,  Weight Gain and Weight Loss. Skin Present- Change in Wart/Mole and Dryness. Not Present- Hives, Jaundice, New Lesions, Non-Healing Wounds, Rash and Ulcer. HEENT Present- Hearing Loss, Ringing in the Ears, Seasonal Allergies, Sinus Pain and Wears glasses/contact lenses. Not  Present- Earache, Hoarseness, Nose Bleed, Oral Ulcers, Sore Throat, Visual Disturbances and Yellow Eyes. Respiratory Present- Snoring. Not Present- Bloody sputum, Chronic Cough, Difficulty Breathing and Wheezing. Breast Not Present- Breast Mass, Breast Pain, Nipple Discharge and Skin Changes. Cardiovascular Present- Leg Cramps. Not Present- Chest Pain, Difficulty Breathing Lying Down, Palpitations, Rapid Heart Rate, Shortness of Breath and Swelling of Extremities. Gastrointestinal Present- Change in Bowel Habits, Hemorrhoids and Indigestion. Not Present- Abdominal Pain, Bloating, Bloody Stool, Chronic diarrhea, Constipation, Difficulty Swallowing, Excessive gas, Gets full quickly at meals, Nausea, Rectal Pain and Vomiting. Male Genitourinary Present- Change in Urinary Stream and Urgency. Not Present- Blood in Urine, Frequency, Impotence, Nocturia, Painful Urination and Urine Leakage. Musculoskeletal Present- Back Pain, Joint Pain, Joint Stiffness, Muscle Pain and Muscle Weakness. Not Present- Swelling of Extremities. Neurological Present- Headaches and Numbness. Not Present- Decreased Memory, Fainting, Seizures, Tingling, Tremor, Trouble walking and Weakness. Psychiatric Not Present- Anxiety, Bipolar, Change in Sleep Pattern, Depression, Fearful and Frequent crying. Endocrine Present- Hair Changes. Not Present- Cold Intolerance, Excessive Hunger, Heat Intolerance, Hot flashes and New Diabetes. Hematology Not Present- Blood Thinners, Easy Bruising, Excessive bleeding, Gland problems, HIV and Persistent Infections.  Vitals ([name] CMA; 05/18/2020 9:19 AM) 05/18/2020 9:18 AM Weight: 205.25 lb Height: 73in Body Surface Area:  2.18 m Body Mass Index: 27.08 kg/m  Temp.: 98.16F  Pulse: 71 (Regular)         Physical Exam Adin Hector MD; 05/18/2020 12:38 PM)  General Mental Status-Alert. General Appearance-Not in acute distress, Not Sickly. Orientation-Oriented X3. Hydration-Well hydrated. Voice-Normal.  Integumentary Global Assessment Upon inspection and palpation of skin surfaces of the - Axillae: non-tender, no inflammation or ulceration, no drainage. and Distribution of scalp and body hair is normal. General Characteristics Temperature - normal warmth is noted.  Head and Neck Head-normocephalic, atraumatic with no lesions or palpable masses. Face Global Assessment - atraumatic, no absence of expression. Neck Global Assessment - no abnormal movements, no bruit auscultated on the right, no bruit auscultated on the left, no decreased range of motion, non-tender. Trachea-midline. Thyroid Gland Characteristics - non-tender.  Eye Eyeball - Left-Extraocular movements intact, No Nystagmus - Left. Eyeball - Right-Extraocular movements intact, No Nystagmus - Right. Cornea - Left-No Hazy - Left. Cornea - Right-No Hazy - Right. Sclera/Conjunctiva - Left-No scleral icterus, No Discharge - Left. Sclera/Conjunctiva - Right-No scleral icterus, No Discharge - Right. Pupil - Left-Direct reaction to light normal. Pupil - Right-Direct reaction to light normal.  ENMT Ears Pinna - Left - no drainage observed, no generalized tenderness observed. Pinna - Right - no drainage observed, no generalized tenderness observed. Nose and Sinuses External Inspection of the Nose - no destructive lesion observed. Inspection of the nares - Left - quiet respiration. Inspection of the nares - Right - quiet respiration. Mouth and Throat Lips - Upper Lip - no fissures observed, no pallor noted. Lower Lip - no fissures observed, no pallor noted. Nasopharynx - no discharge present. Oral  Cavity/Oropharynx - Tongue - no dryness observed. Oral Mucosa - no cyanosis observed. Hypopharynx - no evidence of airway distress observed.  Chest and Lung Exam Inspection Movements - Normal and Symmetrical. Accessory muscles - No use of accessory muscles in breathing. Palpation Palpation of the chest reveals - Non-tender. Auscultation Breath sounds - Normal and Clear.  Cardiovascular Auscultation Rhythm - Regular. Murmurs & Other Heart Sounds - Auscultation of the heart reveals - No Murmurs and No Systolic Clicks.  Abdomen Inspection Inspection of the abdomen reveals - No Visible peristalsis and  No Abnormal pulsations. Umbilicus - No Bleeding, No Urine drainage. Palpation/Percussion Palpation and Percussion of the abdomen reveal - Soft, Non Tender, No Rebound tenderness, No Rigidity (guarding) and No Cutaneous hyperesthesia. Note: Abdomen soft. Nontender. Not distended. No umbilical or incisional hernias. No guarding.  Male Genitourinary Sexual Maturity Tanner 5 - Adult hair pattern and Adult penile size and shape. Note: No inguinal hernias. Normal external genitalia. Epididymi, testes, and spermatic cords normal without any masses.  Rectal Note: Some prolapsing hemorrhoidal tissue with a left lateral small thrombosed hemorrhoid.  Perianal skin clean with good hygiene. No pruritis ani. No pilonidal disease. No fissure. No abscess/fistula. Normal sphincter tone.  Some external hemorrhoids. No condyloma warts.  Sensitive but tolerates digital and anoscopic rectal exam. No major stricturing. Some fullness of the hemorrhoidal piles. On anoscopy has a large right posterior prolapsing mass that seems like a hemorrhoid but flattens down. Rest of the anorectal region is more length consistent with grade 1-grade 2 internal hemorrhoids. This seems to correlate with the concern on the retroflexed colonoscopy in view of the anorectal region.   Exam done with assistance of  male Medical Assistant in the room.  Peripheral Vascular Upper Extremity Inspection - Left - No Cyanotic nailbeds - Left, Not Ischemic. Inspection - Right - No Cyanotic nailbeds - Right, Not Ischemic.  Neurologic Neurologic evaluation reveals -normal attention span and ability to concentrate, able to name objects and repeat phrases. Appropriate fund of knowledge , normal sensation and normal coordination. Mental Status Affect - not angry, not paranoid. Cranial Nerves-Normal Bilaterally. Gait-Normal.  Neuropsychiatric Mental status exam performed with findings of-able to articulate well with normal speech/language, rate, volume and coherence, thought content normal with ability to perform basic computations and apply abstract reasoning and no evidence of hallucinations, delusions, obsessions or homicidal/suicidal ideation.  Musculoskeletal Global Assessment Spine, Ribs and Pelvis - no instability, subluxation or laxity. Right Upper Extremity - no instability, subluxation or laxity.  Lymphatic Head & Neck  General Head & Neck Lymphatics: Bilateral - Description - No Localized lymphadenopathy. Axillary  General Axillary Region: Bilateral - Description - No Localized lymphadenopathy. Femoral & Inguinal  Generalized Femoral & Inguinal Lymphatics: Left - Description - No Localized lymphadenopathy. Right - Description - No Localized lymphadenopathy.    Assessment & Plan Adin Hector MD; 05/18/2020 12:41 PM)  Gareth Morgan FOR PREOPERATIVE EXAMINATION FOR GENERAL SURGICAL PROCEDURE (Z01.818)  Current Plans The anatomy and the physiology was discussed. The pathophysiology and natural history of the disease was discussed. Options were discussed and recommendations were made. Technique, risks, benefits, & alternatives were discussed. Risks such as stroke, heart attack, bleeding, indection, death, and other risks discussed. Questions answered. The patient agrees to proceed. Pt  Education - [facility] Rectal Prep for Anorectal outpatient/office surgery: discussed with patient and provided information. Pt Education - [facility] Rectal Surgery HCI (Shadoe Bethel): discussed with patient and provided information.  ADENOMATOUS RECTAL POLYP (D12.8) Impression: Adenomatous polyp noted at the anal verge that seems to most likely be associated with a right posterior hemorrhoid.  I think this warrants anorectal examination under anesthesia with hemorrhoidal ligation pexing removal of this region. Excisional biopsy of any suspicious areas - any persistent adenomatous tissue/polyp. Should be an outpatient surgery. I do not think that this is significant or proximal enough to warrant a TEM/TAMIS platform.  Probably will benefit with repeat flexible sigmoidoscopy in 3-6 months to make sure there is no evidence of any recurrence adenomatous tissue if pathology is underwhelming on the excision.  Current  Plans Pt Education - Polyps in the Colon and Rectum (Colonic and Rectal Polyps): colonic polyps You are being scheduled for surgery- Our schedulers will call you.  You should hear from our office's scheduling department within 5 working days about the location, date, and time of surgery. We try to make accommodations for patient's preferences in scheduling surgery, but sometimes the OR schedule or the surgeon's schedule prevents [place] from making those accommodations.  If you have not heard from our office [phone]) in 5 working days, call the office and ask for your surgeon's nurse.  If you have other questions about your diagnosis, plan, or surgery, call the office and ask for your surgeon's nurse.   EXTERNAL HEMORRHOIDS WITH COMPLICATION (Z30.8) Impression: Recurrent persistent hemorrhoids. They do bother him regularly. He wished to have that addressed at the time of removal of the rectal polyp. Offered hemorrhoidal ligation and pexing and excision of any persistent prolapsing more external  tissue.  Current Plans Pt Education - [facility] Hemorrhoids (Jocelynn Gioffre): discussed with patient and provided information. Pt Education - Pamphlet Given - The Hemorrhoid Book: discussed with patient and provided information. The anatomy & physiology of the anorectal region was discussed. The pathophysiology of hemorrhoids and differential diagnosis was discussed. Natural history risks without surgery was discussed. I stressed the importance of a bowel regimen to have daily soft bowel movements to minimize progression of disease. Interventions such as sclerotherapy & banding were discussed.  The patient's symptoms are not adequately controlled by medicines and other non-operative treatments. I feel the risks & problems of no surgery outweigh the operative risks; therefore, I recommended surgery to treat the hemorrhoids by ligation, pexy, and possible resection.  Risks such as bleeding, infection, urinary difficulties, need for further treatment, heart attack, death, and other risks were discussed. I noted a good likelihood this will help address the problem. Goals of post-operative recovery were discussed as well. Possibility that this will not correct all symptoms was explained. Post-operative pain, bleeding, constipation, and other problems after surgery were discussed. We will work to minimize complications. Educational handouts further explaining the pathology, treatment options, and bowel regimen were given as well. Questions were answered. The patient expresses understanding & wishes to proceed with surgery.   PROLAPSED INTERNAL HEMORRHOIDS, GRADE 3 (K64.2) Impression: The anatomy & physiology of the anorectal region was discussed. The pathophysiology of hemorrhoids and differential diagnosis was discussed. Natural history progression was discussed. I stressed the importance of a bowel regimen to have daily soft bowel movements to minimize progression of disease. Goal of one BM / day ideal.  Use of wet wipes, warm baths, avoiding straining, etc were emphasized.  Educational handouts further explaining the pathology, treatment options, and bowel regimen were given as well. The patient expressed understanding.  Adin Hector, MD, FACS, MASCRS  Gastrointestinal and Minimally Invasive Surgery  [facility] [address], [place], [place] [address] [phone] Fax [phone] Main/Paging  CONTACT INFORMATION: Weekday (9AM-5PM) concerns: Call [facility] main office at [phone] Weeknight (5PM-9AM) or Weekend/Holiday concerns: Check [email or web address] for General Surgery [facility] coverage (Please, do not use SecureChat as it is not reliable communication to operating surgeons for immediate patient care)

## 2020-06-18 ENCOUNTER — Other Ambulatory Visit: Payer: Self-pay

## 2020-06-18 ENCOUNTER — Encounter (HOSPITAL_BASED_OUTPATIENT_CLINIC_OR_DEPARTMENT_OTHER): Payer: Self-pay | Admitting: Surgery

## 2020-06-18 NOTE — Progress Notes (Signed)
Spoke w/ via phone for pre-op interview---pt Lab needs dos----  None has lab appt 06-22-2020 cbc bmp ekg 01-25-2021 845 am             Lab results------none COVID test ------06-22-2020 1000 am Arrive at -------730 am 06-25-2020 NPO after MN NO Solid Food.  Clear liquids from MN until-- drink ensure presurgery drink at 630 am - then npo Med rec completed Medications to take morning of surgery -----metorpolol tartrate, allopurinol, gabapentin, slloclearl Diabetic medication ----- Patient instructed to bring photo id and insurance card day of surgery Patient aware to have Driver (ride ) / caregiver  Wife [name] will stay   for 24 hours after surgery  Patient Special Instructions -----follow all bowel prep instructions from dr gross Pre-Op special Istructions -----none Patient verbalized understanding of instructions that were given at this phone interview. Patient denies shortness of breath, chest pain, fever, cough at this phone interview.

## 2020-06-22 ENCOUNTER — Other Ambulatory Visit: Payer: Self-pay

## 2020-06-22 ENCOUNTER — Ambulatory Visit: Payer: Self-pay | Admitting: Surgery

## 2020-06-22 ENCOUNTER — Other Ambulatory Visit (HOSPITAL_COMMUNITY)
Admission: RE | Admit: 2020-06-22 | Discharge: 2020-06-22 | Disposition: A | Discharge status: Home / Self Care | Payer: Medicare Other | Source: Ambulatory Visit | Attending: Surgery | Admitting: Surgery

## 2020-06-22 ENCOUNTER — Encounter (HOSPITAL_COMMUNITY)
Admission: RE | Admit: 2020-06-22 | Discharge: 2020-06-22 | Disposition: A | Discharge status: Home / Self Care | Payer: Medicare Other | Source: Ambulatory Visit | Attending: Surgery | Admitting: Surgery

## 2020-06-22 DIAGNOSIS — Z20822 Contact with and (suspected) exposure to covid-19: Secondary | ICD-10-CM | POA: Insufficient documentation

## 2020-06-22 DIAGNOSIS — Z01818 Encounter for other preprocedural examination: Secondary | ICD-10-CM | POA: Insufficient documentation

## 2020-06-22 DIAGNOSIS — Z01812 Encounter for preprocedural laboratory examination: Secondary | ICD-10-CM | POA: Insufficient documentation

## 2020-06-22 LAB — BASIC METABOLIC PANEL
Anion gap: 7 (ref 5–15)
BUN: 20 mg/dL (ref 8–23)
CO2: 23 mmol/L (ref 22–32)
Calcium: 8.9 mg/dL (ref 8.9–10.3)
Chloride: 108 mmol/L (ref 98–111)
Creatinine, Ser: 1.12 mg/dL (ref 0.61–1.24)
GFR, Estimated: >60 mL/min (ref >60)
Glucose, Bld: 118 mg/dL — ABNORMAL HIGH (ref 70–99)
Potassium: 3.9 mmol/L (ref 3.5–5.1)
Sodium: 138 mmol/L (ref 135–145)

## 2020-06-22 LAB — CBC
HCT: 41.5 % (ref 39.0–52.0)
Hemoglobin: 13.8 g/dL (ref 13.0–17.0)
MCH: 31.6 pg (ref 26.0–34.0)
MCHC: 33.3 g/dL (ref 30.0–36.0)
MCV: 95 fL (ref 80.0–100.0)
Platelets: 180 10*3/uL (ref 150–400)
RBC: 4.37 MIL/uL (ref 4.22–5.81)
RDW: 13.6 % (ref 11.5–15.5)
WBC: 8.2 10*3/uL (ref 4.0–10.5)
nRBC: 0 % (ref 0.0–0.2)

## 2020-06-22 LAB — SARS CORONAVIRUS 2 (TAT 6-24 HRS): SARS Coronavirus 2: NEGATIVE

## 2020-06-25 ENCOUNTER — Encounter (HOSPITAL_BASED_OUTPATIENT_CLINIC_OR_DEPARTMENT_OTHER)
Admission: RE | Disposition: A | Discharge status: Home / Self Care | Payer: Self-pay | Source: Other Acute Inpatient Hospital | Attending: Surgery

## 2020-06-25 ENCOUNTER — Other Ambulatory Visit: Payer: Self-pay

## 2020-06-25 ENCOUNTER — Ambulatory Visit (HOSPITAL_BASED_OUTPATIENT_CLINIC_OR_DEPARTMENT_OTHER): Payer: Medicare Other | Admitting: Anesthesiology

## 2020-06-25 ENCOUNTER — Encounter (HOSPITAL_BASED_OUTPATIENT_CLINIC_OR_DEPARTMENT_OTHER): Payer: Self-pay | Admitting: Surgery

## 2020-06-25 ENCOUNTER — Ambulatory Visit (HOSPITAL_BASED_OUTPATIENT_CLINIC_OR_DEPARTMENT_OTHER)
Admission: RE | Admit: 2020-06-25 | Discharge: 2020-06-25 | Disposition: A | Discharge status: Home / Self Care | Payer: Medicare Other | Source: Other Acute Inpatient Hospital | Attending: Surgery | Admitting: Surgery

## 2020-06-25 DIAGNOSIS — Z885 Allergy status to narcotic agent status: Secondary | ICD-10-CM | POA: Insufficient documentation

## 2020-06-25 DIAGNOSIS — Z79899 Other long term (current) drug therapy: Secondary | ICD-10-CM | POA: Insufficient documentation

## 2020-06-25 DIAGNOSIS — D128 Benign neoplasm of rectum: Secondary | ICD-10-CM | POA: Insufficient documentation

## 2020-06-25 DIAGNOSIS — K6289 Other specified diseases of anus and rectum: Secondary | ICD-10-CM

## 2020-06-25 DIAGNOSIS — K642 Third degree hemorrhoids: Secondary | ICD-10-CM | POA: Diagnosis not present

## 2020-06-25 DIAGNOSIS — Z887 Allergy status to serum and vaccine status: Secondary | ICD-10-CM | POA: Insufficient documentation

## 2020-06-25 DIAGNOSIS — Z9049 Acquired absence of other specified parts of digestive tract: Secondary | ICD-10-CM | POA: Diagnosis not present

## 2020-06-25 DIAGNOSIS — K644 Residual hemorrhoidal skin tags: Secondary | ICD-10-CM | POA: Insufficient documentation

## 2020-06-25 DIAGNOSIS — Z8719 Personal history of other diseases of the digestive system: Secondary | ICD-10-CM | POA: Insufficient documentation

## 2020-06-25 HISTORY — PX: EVALUATION UNDER ANESTHESIA WITH HEMORRHOIDECTOMY: SHX5624

## 2020-06-25 HISTORY — DX: Prediabetes: R73.03

## 2020-06-25 HISTORY — DX: Presence of spectacles and contact lenses: Z97.3

## 2020-06-25 HISTORY — DX: Polyneuropathy, unspecified: G62.9

## 2020-06-25 SURGERY — EXAM UNDER ANESTHESIA WITH HEMORRHOIDECTOMY
Anesthesia: General | Site: Rectum

## 2020-06-25 MED ORDER — OXYCODONE HCL 5 MG/5ML PO SOLN: 5.0000 mg | Freq: Once | ORAL | Status: AC | PRN

## 2020-06-25 MED ORDER — ACETAMINOPHEN 325 MG PO TABS
325.0000 mg | ORAL_TABLET | ORAL | Status: DC | PRN
Start: 2020-06-25 — End: 2020-06-25

## 2020-06-25 MED ORDER — PROPOFOL 10 MG/ML IV BOLUS
INTRAVENOUS | Status: DC | PRN
  Administered 2020-06-25: 160 mg via INTRAVENOUS

## 2020-06-25 MED ORDER — CHLORHEXIDINE GLUCONATE CLOTH 2 % EX PADS: 6.0000 | MEDICATED_PAD | Freq: Once | CUTANEOUS | Status: DC

## 2020-06-25 MED ORDER — LIDOCAINE HCL (CARDIAC) PF 100 MG/5ML IV SOSY
PREFILLED_SYRINGE | INTRAVENOUS | Status: DC | PRN
  Administered 2020-06-25: 100 mg via INTRAVENOUS

## 2020-06-25 MED ORDER — GABAPENTIN 300 MG PO CAPS
ORAL_CAPSULE | ORAL | Status: AC
  Filled 2020-06-25: qty 1

## 2020-06-25 MED ORDER — ONDANSETRON HCL 4 MG/2ML IJ SOLN
INTRAMUSCULAR | Status: AC
  Filled 2020-06-25: qty 2

## 2020-06-25 MED ORDER — ACETAMINOPHEN 500 MG PO TABS
ORAL_TABLET | ORAL | Status: AC
  Filled 2020-06-25: qty 2

## 2020-06-25 MED ORDER — DEXAMETHASONE SODIUM PHOSPHATE 10 MG/ML IJ SOLN
INTRAMUSCULAR | Status: AC
  Filled 2020-06-25: qty 1

## 2020-06-25 MED ORDER — SODIUM CHLORIDE 0.9 % IV SOLN
2.0000 g | INTRAVENOUS | Status: AC
  Administered 2020-06-25: 2 g via INTRAVENOUS

## 2020-06-25 MED ORDER — FENTANYL CITRATE (PF) 100 MCG/2ML IJ SOLN
INTRAMUSCULAR | Status: AC
  Filled 2020-06-25: qty 2

## 2020-06-25 MED ORDER — METRONIDAZOLE IN NACL 5-0.79 MG/ML-% IV SOLN
INTRAVENOUS | Status: AC
  Filled 2020-06-25: qty 100

## 2020-06-25 MED ORDER — BUPIVACAINE LIPOSOME 1.3 % IJ SUSP: 20.0000 mL | Freq: Once | INTRAMUSCULAR | Status: DC

## 2020-06-25 MED ORDER — METRONIDAZOLE IN NACL 5-0.79 MG/ML-% IV SOLN
500.0000 mg | INTRAVENOUS | Status: AC
  Administered 2020-06-25: 500 mg via INTRAVENOUS

## 2020-06-25 MED ORDER — ENSURE PRE-SURGERY PO LIQD: 296.0000 mL | Freq: Once | ORAL | Status: DC

## 2020-06-25 MED ORDER — FENTANYL CITRATE (PF) 100 MCG/2ML IJ SOLN
INTRAMUSCULAR | Status: DC | PRN
  Administered 2020-06-25: 100 ug via INTRAVENOUS
  Administered 2020-06-25 (×2): 25 ug via INTRAVENOUS
  Administered 2020-06-25: 50 ug via INTRAVENOUS

## 2020-06-25 MED ORDER — MEPERIDINE HCL 25 MG/ML IJ SOLN: 6.2500 mg | INTRAMUSCULAR | Status: DC | PRN

## 2020-06-25 MED ORDER — ONDANSETRON HCL 4 MG/2ML IJ SOLN
INTRAMUSCULAR | Status: DC | PRN
  Administered 2020-06-25: 4 mg via INTRAVENOUS

## 2020-06-25 MED ORDER — DIBUCAINE (PERIANAL) 1 % EX OINT
TOPICAL_OINTMENT | CUTANEOUS | Status: DC | PRN
  Administered 2020-06-25: 1 via RECTAL

## 2020-06-25 MED ORDER — OXYCODONE HCL 5 MG PO TABS
5.0000 mg | ORAL_TABLET | Freq: Once | ORAL | Status: AC | PRN
  Administered 2020-06-25: 5 mg via ORAL

## 2020-06-25 MED ORDER — FENTANYL CITRATE (PF) 100 MCG/2ML IJ SOLN
25.0000 ug | INTRAMUSCULAR | Status: DC | PRN
  Administered 2020-06-25: 25 ug via INTRAVENOUS

## 2020-06-25 MED ORDER — ACETAMINOPHEN 160 MG/5ML PO SOLN: 325.0000 mg | ORAL | Status: DC | PRN

## 2020-06-25 MED ORDER — PROPOFOL 10 MG/ML IV BOLUS
INTRAVENOUS | Status: AC
  Filled 2020-06-25: qty 40

## 2020-06-25 MED ORDER — OXYCODONE HCL 5 MG PO TABS
ORAL_TABLET | ORAL | Status: AC
  Filled 2020-06-25: qty 1

## 2020-06-25 MED ORDER — ONDANSETRON HCL 4 MG/2ML IJ SOLN: 4.0000 mg | Freq: Once | INTRAMUSCULAR | Status: DC | PRN

## 2020-06-25 MED ORDER — GLYCOPYRROLATE 0.2 MG/ML IJ SOLN
INTRAMUSCULAR | Status: DC | PRN
  Administered 2020-06-25: .2 mg via INTRAVENOUS

## 2020-06-25 MED ORDER — BUPIVACAINE LIPOSOME 1.3 % IJ SUSP
INTRAMUSCULAR | Status: DC | PRN
  Administered 2020-06-25: 20 mL

## 2020-06-25 MED ORDER — SUCCINYLCHOLINE CHLORIDE 200 MG/10ML IV SOSY
PREFILLED_SYRINGE | INTRAVENOUS | Status: AC
  Filled 2020-06-25: qty 10

## 2020-06-25 MED ORDER — EPHEDRINE SULFATE 50 MG/ML IJ SOLN
INTRAMUSCULAR | Status: DC | PRN
  Administered 2020-06-25 (×2): 20 mg via INTRAVENOUS

## 2020-06-25 MED ORDER — SODIUM CHLORIDE 0.9 % IV SOLN
INTRAVENOUS | Status: AC
  Filled 2020-06-25: qty 100

## 2020-06-25 MED ORDER — LACTATED RINGERS IV SOLN: INTRAVENOUS | Status: DC | PRN

## 2020-06-25 MED ORDER — GLYCOPYRROLATE PF 0.2 MG/ML IJ SOSY
PREFILLED_SYRINGE | INTRAMUSCULAR | Status: AC
  Filled 2020-06-25: qty 1

## 2020-06-25 MED ORDER — EPHEDRINE 5 MG/ML INJ
INTRAVENOUS | Status: AC
  Filled 2020-06-25: qty 10

## 2020-06-25 MED ORDER — DIAZEPAM 5 MG PO TABS: 5.0000 mg | ORAL_TABLET | Freq: Four times a day (QID) | ORAL | 2 refills | Status: AC | PRN

## 2020-06-25 MED ORDER — GABAPENTIN 300 MG PO CAPS: 300.0000 mg | ORAL_CAPSULE | ORAL | Status: DC

## 2020-06-25 MED ORDER — OXYCODONE HCL 5 MG PO TABS: 5.0000 mg | ORAL_TABLET | Freq: Four times a day (QID) | ORAL | 0 refills | Status: DC | PRN

## 2020-06-25 MED ORDER — SODIUM CHLORIDE 0.9 % IV SOLN: INTRAVENOUS | Status: DC

## 2020-06-25 MED ORDER — DEXAMETHASONE SODIUM PHOSPHATE 4 MG/ML IJ SOLN
INTRAMUSCULAR | Status: DC | PRN
  Administered 2020-06-25: 5 mg via INTRAVENOUS

## 2020-06-25 MED ORDER — SUCCINYLCHOLINE CHLORIDE 20 MG/ML IJ SOLN
INTRAMUSCULAR | Status: DC | PRN
  Administered 2020-06-25: 160 mg via INTRAVENOUS

## 2020-06-25 MED ORDER — ACETAMINOPHEN 500 MG PO TABS
1000.0000 mg | ORAL_TABLET | ORAL | Status: AC
  Administered 2020-06-25: 1000 mg via ORAL

## 2020-06-25 MED ORDER — BUPIVACAINE-EPINEPHRINE 0.25% -1:200000 IJ SOLN
INTRAMUSCULAR | Status: DC | PRN
  Administered 2020-06-25: 30 mL

## 2020-06-25 MED ORDER — LIDOCAINE 2% (20 MG/ML) 5 ML SYRINGE
INTRAMUSCULAR | Status: AC
  Filled 2020-06-25: qty 5

## 2020-06-25 MED ORDER — CEFTRIAXONE SODIUM 2 G IJ SOLR
INTRAMUSCULAR | Status: AC
  Filled 2020-06-25: qty 20

## 2020-06-25 SURGICAL SUPPLY — 59 items
APL SKNCLS STERI-STRIP NONHPOA (GAUZE/BANDAGES/DRESSINGS) ×1
BENZOIN TINCTURE PRP APPL 2/3 (GAUZE/BANDAGES/DRESSINGS) ×2 IMPLANT
BLADE HEX COATED 2.75 (ELECTRODE) ×2 IMPLANT
BLADE SURG 10 STRL SS (BLADE) IMPLANT
BLADE SURG 15 STRL LF DISP TIS (BLADE) ×1 IMPLANT
BLADE SURG 15 STRL SS (BLADE) ×2
BRIEF STRETCH FOR OB PAD LRG (UNDERPADS AND DIAPERS) ×2 IMPLANT
CANISTER SUCT 1200ML W/VALVE (MISCELLANEOUS) ×2 IMPLANT
COVER BACK TABLE 60X90IN (DRAPES) ×2 IMPLANT
COVER MAYO STAND STRL (DRAPES) ×2 IMPLANT
COVER WAND RF STERILE (DRAPES) ×2 IMPLANT
DECANTER SPIKE VIAL GLASS SM (MISCELLANEOUS) ×2 IMPLANT
DRAPE HYSTEROSCOPY (MISCELLANEOUS) IMPLANT
DRAPE LAPAROTOMY 100X72 PEDS (DRAPES) ×2 IMPLANT
DRAPE SHEET LG 3/4 BI-LAMINATE (DRAPES) IMPLANT
DRSG PAD ABDOMINAL 8X10 ST (GAUZE/BANDAGES/DRESSINGS) ×2 IMPLANT
ELECT NEEDLE TIP 2.8 STRL (NEEDLE) IMPLANT
ELECT REM PT RETURN 9FT ADLT (ELECTROSURGICAL) ×2
ELECTRODE REM PT RTRN 9FT ADLT (ELECTROSURGICAL) ×1 IMPLANT
FILTER STRAW (MISCELLANEOUS) ×2 IMPLANT
GAUZE SPONGE 4X4 12PLY STRL LF (GAUZE/BANDAGES/DRESSINGS) ×2 IMPLANT
GLOVE SURG ENC MOIS LTX SZ6.5 (GLOVE) ×4 IMPLANT
GLOVE SURG LTX SZ8 (GLOVE) ×2 IMPLANT
GLOVE SURG UNDER LTX SZ8 (GLOVE) ×2 IMPLANT
GLOVE SURG UNDER POLY LF SZ6.5 (GLOVE) ×2 IMPLANT
GLOVE SURG UNDER POLY LF SZ7 (GLOVE) ×4 IMPLANT
GOWN STRL REUS W/TWL LRG LVL3 (GOWN DISPOSABLE) ×2 IMPLANT
GOWN STRL REUS W/TWL XL LVL3 (GOWN DISPOSABLE) ×4 IMPLANT
IV CATH PLACEMENT 20 GA (IV SOLUTION) ×2 IMPLANT
KIT SIGMOIDOSCOPE (SET/KITS/TRAYS/PACK) IMPLANT
KIT TURNOVER CYSTO (KITS) ×2 IMPLANT
LEGGING LITHOTOMY PAIR STRL (DRAPES) IMPLANT
MANIFOLD NEPTUNE II (INSTRUMENTS) ×2 IMPLANT
NEEDLE HYPO 22GX1.5 SAFETY (NEEDLE) ×2 IMPLANT
NS IRRIG 500ML POUR BTL (IV SOLUTION) ×2 IMPLANT
PACK BASIN DAY SURGERY FS (CUSTOM PROCEDURE TRAY) ×2 IMPLANT
PAD PREP 24X48 CUFFED NSTRL (MISCELLANEOUS) IMPLANT
PENCIL SMOKE EVACUATOR (MISCELLANEOUS) ×2 IMPLANT
SCRUB TECHNI CARE 4 OZ NO DYE (MISCELLANEOUS) ×2 IMPLANT
SHEARS HARMONIC 9CM CVD (BLADE) IMPLANT
SURGILUBE 2OZ TUBE FLIPTOP (MISCELLANEOUS) ×2 IMPLANT
SUT CHROMIC 2 0 SH (SUTURE) IMPLANT
SUT CHROMIC 3 0 SH 27 (SUTURE) ×2 IMPLANT
SUT VIC AB 2-0 SH 27 (SUTURE)
SUT VIC AB 2-0 SH 27XBRD (SUTURE) IMPLANT
SUT VIC AB 2-0 UR6 27 (SUTURE) ×12 IMPLANT
SUT VICRYL 0 UR6 27IN ABS (SUTURE) IMPLANT
SUT VICRYL AB 2 0 TIE (SUTURE) IMPLANT
SUT VICRYL AB 2 0 TIES (SUTURE)
SYR 20ML LL LF (SYRINGE) ×4 IMPLANT
SYR 27GX1/2 1ML LL SAFETY (SYRINGE) ×2 IMPLANT
SYR BULB IRRIG 60ML STRL (SYRINGE) ×2 IMPLANT
SYR CONTROL 10ML LL (SYRINGE) IMPLANT
TAPE CLOTH 3X10 TAN LF (GAUZE/BANDAGES/DRESSINGS) ×2 IMPLANT
TOWEL OR 17X26 10 PK STRL BLUE (TOWEL DISPOSABLE) ×2 IMPLANT
TRAY DSU PREP LF (CUSTOM PROCEDURE TRAY) ×2 IMPLANT
TUBE CONNECTING 12X1/4 (SUCTIONS) ×2 IMPLANT
UNDERPAD 30X36 HEAVY ABSORB (UNDERPADS AND DIAPERS) ×2 IMPLANT
YANKAUER SUCT BULB TIP NO VENT (SUCTIONS) ×2 IMPLANT

## 2020-06-25 NOTE — Transfer of Care (Signed)
Immediate Anesthesia Transfer of Care Note  Patient: [name]  Procedure(s) Performed: Procedure(s) (LRB): EXCISION OF DISTAL RECTAL MASS,  HEMORRHOIDAL LIGATION AND PEXY,  HEMORRHOIDECTOMY. ANORECTAL EXAMINATION UNDER ANESTHESIA (N/A)  Patient Location: PACU  Anesthesia Type: General  Level of Consciousness: awake, sedated, patient cooperative and responds to stimulation  Airway & Oxygen Therapy: Patient Spontanous Breathing and Patient connected to [place] 02 and soft FM   Post-op Assessment: Report given to PACU RN, Post -op Vital signs reviewed and stable and Patient moving all extremities  Post vital signs: Reviewed and stable  Complications: No apparent anesthesia complications

## 2020-06-25 NOTE — H&P (Signed)
[name] DOB: [birth year] Married / Language: English / Race: White Male  Patient Care Team: Lawerance Cruel, MD as PCP - General (Family Medicine) Michael Boston, MD as Consulting Physician (General Surgery) Otis Brace, MD as Consulting Physician (Gastroenterology) ` ` Patient sent for surgical consultation at the request of [facility]  Chief Complaint: Hemorrhoids and very distal adenomatous polyp. ` ` The patient is a 70 year old male. Underwent colonoscopy. Diverticulosis polyps noted. Removed. Very distal rectal polyp mass biopsied as well. Systems adenomatous polyp. Significant hemorrhoids. Surgical consultation recommended. The patient retired from [employer]. He's had a lot of colonoscopies because his father was diagnosed with colon cancer. On last colonoscopy made to start a polyp was noted. He recalls some of the discussion that those questions. He usually moves his bowels a couple times a day in the morning. The next review. He had a cholecystectomy but no other abdominal surgery.  The joint surgery. No history of stroke or heart attack. Not on blood thinners. No diabetes. Does not smoke. No sleep apnea. Has some heartburn and reflux but otherwise no major GI issues. Does not recall any severe pain or rectal bleeding. He's had prolapsing hemorrhoids or external hemorrhoids for some time. He had had hemorrhoid surgery when he was 58. I think he was discouraged from considering hemorrhoid surgery again about a decade ago. Notes are conflicting.  (Review of systems as stated in this history (HPI) or in the review of systems. Otherwise all other 12 point ROS are negative) ` ` ###########################################`  This patient encounter took 30 minutes today to perform the following: obtain history, perform exam, review outside records, interpret tests & imaging, counsel the patient on their diagnosis; and, document  this encounter, including findings & plan in the electronic health record (EHR).   Past Surgical History ([name], [place]; 05/18/2020 9:17 AM) Appendectomy Colon Polyp Removal - Colonoscopy Colon Polyp Removal - Open Foot Surgery Bilateral. Gallbladder Surgery - Open Hemorrhoidectomy Knee Surgery Bilateral. Spinal Surgery - Neck Tonsillectomy  Diagnostic Studies History ([name], CMA; 05/18/2020 9:17 AM) Colonoscopy within last year  Allergies ([name], CMA; 05/18/2020 9:18 AM) Codeine Phosphate *ANALGESICS - OPIOID* Demerol *ANALGESICS - OPIOID* Dilaudid *ANALGESICS - OPIOID* Shingrix *VACCINES* Pneumovax 23 *VACCINES* Allergies Reconciled  Medication History ([name], CMA; 05/18/2020 9:18 AM) Omeprazole (20MG  Capsule DR, Oral) Active. Amoxicillin (500MG  Capsule, Oral) Active. Metoprolol Tartrate (50MG  Tablet, Oral) Active. Losartan Potassium (100MG  Tablet, Oral) Active. Simvastatin (40MG  Tablet, Oral) Active. Gabapentin (300MG  Capsule, Oral) Active. Medications Reconciled  Social History [name], CMA; 05/18/2020 9:17 AM) Alcohol use Occasional alcohol use. Caffeine use Carbonated beverages, Coffee, Tea. No drug use Tobacco use Never smoker.  Family History [name], [place]; 05/18/2020 9:17 AM) Arthritis Brother, Mother, Sister. Colon Cancer Father. Colon Polyps Brother, Father. Depression Sister. Diabetes Mellitus Father, Mother, Sister. Heart Disease Mother. Hypertension Sister. Kidney Disease Family Members In General.  Other Problems [name], [place]; 05/18/2020 9:17 AM) Arthritis Back Pain Bladder Problems Diverticulosis Gastroesophageal Reflux Disease Hemorrhoids High blood pressure Hypercholesterolemia     Review of Systems ([place]; 05/18/2020 9:17 AM) General Present- Chills and Night Sweats. Not Present- Appetite Loss, Fatigue, Fever, Weight Gain and Weight Loss. Skin  Present- Change in Wart/Mole and Dryness. Not Present- Hives, Jaundice, New Lesions, Non-Healing Wounds, Rash and Ulcer. HEENT Present- Hearing Loss, Ringing in the Ears, Seasonal Allergies, Sinus Pain and Wears glasses/contact lenses. Not Present- Earache, Hoarseness, Nose Bleed, Oral Ulcers, Sore Throat, Visual Disturbances and Yellow  Eyes. Respiratory Present- Snoring. Not Present- Bloody sputum, Chronic Cough, Difficulty Breathing and Wheezing. Breast Not Present- Breast Mass, Breast Pain, Nipple Discharge and Skin Changes. Cardiovascular Present- Leg Cramps. Not Present- Chest Pain, Difficulty Breathing Lying Down, Palpitations, Rapid Heart Rate, Shortness of Breath and Swelling of Extremities. Gastrointestinal Present- Change in Bowel Habits, Hemorrhoids and Indigestion. Not Present- Abdominal Pain, Bloating, Bloody Stool, Chronic diarrhea, Constipation, Difficulty Swallowing, Excessive gas, Gets full quickly at meals, Nausea, Rectal Pain and Vomiting. Male Genitourinary Present- Change in Urinary Stream and Urgency. Not Present- Blood in Urine, Frequency, Impotence, Nocturia, Painful Urination and Urine Leakage. Musculoskeletal Present- Back Pain, Joint Pain, Joint Stiffness, Muscle Pain and Muscle Weakness. Not Present- Swelling of Extremities. Neurological Present- Headaches and Numbness. Not Present- Decreased Memory, Fainting, Seizures, Tingling, Tremor, Trouble walking and Weakness. Psychiatric Not Present- Anxiety, Bipolar, Change in Sleep Pattern, Depression, Fearful and Frequent crying. Endocrine Present- Hair Changes. Not Present- Cold Intolerance, Excessive Hunger, Heat Intolerance, Hot flashes and New Diabetes. Hematology Not Present- Blood Thinners, Easy Bruising, Excessive bleeding, Gland problems, HIV and Persistent Infections.  Vitals ([name] CMA; 05/18/2020 9:19 AM) 05/18/2020 9:18 AM Weight: 205.25 lb Height: 73in Body Surface Area: 2.18 m Body Mass Index: 27.08  kg/m  Temp.: 98.54F  Pulse: 71 (Regular)         Physical Exam Adin Hector MD; 05/18/2020 12:38 PM)  General Mental Status-Alert. General Appearance-Not in acute distress, Not Sickly. Orientation-Oriented X3. Hydration-Well hydrated. Voice-Normal.  Integumentary Global Assessment Upon inspection and palpation of skin surfaces of the - Axillae: non-tender, no inflammation or ulceration, no drainage. and Distribution of scalp and body hair is normal. General Characteristics Temperature - normal warmth is noted.  Head and Neck Head-normocephalic, atraumatic with no lesions or palpable masses. Face Global Assessment - atraumatic, no absence of expression. Neck Global Assessment - no abnormal movements, no bruit auscultated on the right, no bruit auscultated on the left, no decreased range of motion, non-tender. Trachea-midline. Thyroid Gland Characteristics - non-tender.  Eye Eyeball - Left-Extraocular movements intact, No Nystagmus - Left. Eyeball - Right-Extraocular movements intact, No Nystagmus - Right. Cornea - Left-No Hazy - Left. Cornea - Right-No Hazy - Right. Sclera/Conjunctiva - Left-No scleral icterus, No Discharge - Left. Sclera/Conjunctiva - Right-No scleral icterus, No Discharge - Right. Pupil - Left-Direct reaction to light normal. Pupil - Right-Direct reaction to light normal.  ENMT Ears Pinna - Left - no drainage observed, no generalized tenderness observed. Pinna - Right - no drainage observed, no generalized tenderness observed. Nose and Sinuses External Inspection of the Nose - no destructive lesion observed. Inspection of the nares - Left - quiet respiration. Inspection of the nares - Right - quiet respiration. Mouth and Throat Lips - Upper Lip - no fissures observed, no pallor noted. Lower Lip - no fissures observed, no pallor noted. Nasopharynx - no discharge present. Oral Cavity/Oropharynx -  Tongue - no dryness observed. Oral Mucosa - no cyanosis observed. Hypopharynx - no evidence of airway distress observed.  Chest and Lung Exam Inspection Movements - Normal and Symmetrical. Accessory muscles - No use of accessory muscles in breathing. Palpation Palpation of the chest reveals - Non-tender. Auscultation Breath sounds - Normal and Clear.  Cardiovascular Auscultation Rhythm - Regular. Murmurs & Other Heart Sounds - Auscultation of the heart reveals - No Murmurs and No Systolic Clicks.  Abdomen Inspection Inspection of the abdomen reveals - No Visible peristalsis and No Abnormal pulsations. Umbilicus - No Bleeding, No Urine drainage. Palpation/Percussion Palpation and  Percussion of the abdomen reveal - Soft, Non Tender, No Rebound tenderness, No Rigidity (guarding) and No Cutaneous hyperesthesia. Note: Abdomen soft. Nontender. Not distended. No umbilical or incisional hernias. No guarding.  Male Genitourinary Sexual Maturity Tanner 5 - Adult hair pattern and Adult penile size and shape. Note: No inguinal hernias. Normal external genitalia. Epididymi, testes, and spermatic cords normal without any masses.  Rectal Note: Some prolapsing hemorrhoidal tissue with a left lateral small thrombosed hemorrhoid.  Perianal skin clean with good hygiene. No pruritis ani. No pilonidal disease. No fissure. No abscess/fistula. Normal sphincter tone.  Some external hemorrhoids. No condyloma warts.  Sensitive but tolerates digital and anoscopic rectal exam. No major stricturing. Some fullness of the hemorrhoidal piles. On anoscopy has a large right posterior prolapsing mass that seems like a hemorrhoid but flattens down. Rest of the anorectal region is more length consistent with grade 1-grade 2 internal hemorrhoids. This seems to correlate with the concern on the retroflexed colonoscopy in view of the anorectal region.   Exam done with assistance of male  Medical Assistant in the room.  Peripheral Vascular Upper Extremity Inspection - Left - No Cyanotic nailbeds - Left, Not Ischemic. Inspection - Right - No Cyanotic nailbeds - Right, Not Ischemic.  Neurologic Neurologic evaluation reveals -normal attention span and ability to concentrate, able to name objects and repeat phrases. Appropriate fund of knowledge , normal sensation and normal coordination. Mental Status Affect - not angry, not paranoid. Cranial Nerves-Normal Bilaterally. Gait-Normal.  Neuropsychiatric Mental status exam performed with findings of-able to articulate well with normal speech/language, rate, volume and coherence, thought content normal with ability to perform basic computations and apply abstract reasoning and no evidence of hallucinations, delusions, obsessions or homicidal/suicidal ideation.  Musculoskeletal Global Assessment Spine, Ribs and Pelvis - no instability, subluxation or laxity. Right Upper Extremity - no instability, subluxation or laxity.  Lymphatic Head & Neck  General Head & Neck Lymphatics: Bilateral - Description - No Localized lymphadenopathy. Axillary  General Axillary Region: Bilateral - Description - No Localized lymphadenopathy. Femoral & Inguinal  Generalized Femoral & Inguinal Lymphatics: Left - Description - No Localized lymphadenopathy. Right - Description - No Localized lymphadenopathy.    Assessment & Plan Adin Hector MD; 05/18/2020 12:41 PM)  Gareth Morgan FOR PREOPERATIVE EXAMINATION FOR GENERAL SURGICAL PROCEDURE (Z01.818)  Current Plans The anatomy and the physiology was discussed. The pathophysiology and natural history of the disease was discussed. Options were discussed and recommendations were made. Technique, risks, benefits, & alternatives were discussed. Risks such as stroke, heart attack, bleeding, indection, death, and other risks discussed. Questions answered. The patient agrees to  proceed. Pt Education - [facility] Rectal Prep for Anorectal outpatient/office surgery: discussed with patient and provided information. Pt Education - [facility] Rectal Surgery HCI (Ahmyah Gidley): discussed with patient and provided information.  ADENOMATOUS RECTAL POLYP (D12.8) Impression: Adenomatous polyp noted at the anal verge that seems to most likely be associated with a right posterior hemorrhoid.  I think this warrants anorectal examination under anesthesia with hemorrhoidal ligation pexing removal of this region. Excisional biopsy of any suspicious areas - any persistent adenomatous tissue/polyp. Should be an outpatient surgery. I do not think that this is significant or proximal enough to warrant a TEM/TAMIS platform.  Probably will benefit with repeat flexible sigmoidoscopy in 3-6 months to make sure there is no evidence of any recurrence adenomatous tissue if pathology is underwhelming on the excision.  Current Plans Pt Education - Polyps in the Colon and Rectum (Colonic and Rectal  Polyps): colonic polyps You are being scheduled for surgery- Our schedulers will call you.  You should hear from our office's scheduling department within 5 working days about the location, date, and time of surgery. We try to make accommodations for patient's preferences in scheduling surgery, but sometimes the OR schedule or the surgeon's schedule prevents [place] from making those accommodations.  If you have not heard from our office [phone]) in 5 working days, call the office and ask for your surgeon's nurse.  If you have other questions about your diagnosis, plan, or surgery, call the office and ask for your surgeon's nurse.   EXTERNAL HEMORRHOIDS WITH COMPLICATION (T88.8) Impression: Recurrent persistent hemorrhoids. They do bother him regularly. He wished to have that addressed at the time of removal of the rectal polyp. Offered hemorrhoidal ligation and pexing and excision of any persistent prolapsing  more external tissue.  Current Plans Pt Education - [facility] Hemorrhoids (Shakedra Beam): discussed with patient and provided information. Pt Education - Pamphlet Given - The Hemorrhoid Book: discussed with patient and provided information. The anatomy & physiology of the anorectal region was discussed. The pathophysiology of hemorrhoids and differential diagnosis was discussed. Natural history risks without surgery was discussed. I stressed the importance of a bowel regimen to have daily soft bowel movements to minimize progression of disease. Interventions such as sclerotherapy & banding were discussed.  The patient's symptoms are not adequately controlled by medicines and other non-operative treatments. I feel the risks & problems of no surgery outweigh the operative risks; therefore, I recommended surgery to treat the hemorrhoids by ligation, pexy, and possible resection.  Risks such as bleeding, infection, urinary difficulties, need for further treatment, heart attack, death, and other risks were discussed. I noted a good likelihood this will help address the problem. Goals of post-operative recovery were discussed as well. Possibility that this will not correct all symptoms was explained. Post-operative pain, bleeding, constipation, and other problems after surgery were discussed. We will work to minimize complications. Educational handouts further explaining the pathology, treatment options, and bowel regimen were given as well. Questions were answered. The patient expresses understanding & wishes to proceed with surgery.   PROLAPSED INTERNAL HEMORRHOIDS, GRADE 3 (K64.2) Impression: The anatomy & physiology of the anorectal region was discussed. The pathophysiology of hemorrhoids and differential diagnosis was discussed. Natural history progression was discussed. I stressed the importance of a bowel regimen to have daily soft bowel movements to minimize progression of disease. Goal of  one BM / day ideal. Use of wet wipes, warm baths, avoiding straining, etc were emphasized.  Educational handouts further explaining the pathology, treatment options, and bowel regimen were given as well. The patient expressed understanding.  Adin Hector, MD, FACS, MASCRS  Gastrointestinal and Minimally Invasive Surgery  [facility] [address] [place], [place] [address] [phone] Fax [phone] Main/Paging  CONTACT INFORMATION: Weekday (9AM-5PM) concerns: Call [facility] main office at [phone] Weeknight (5PM-9AM) or Weekend/Holiday concerns: Check [email or web address] for General Surgery [facility] coverage (Please, do not use SecureChat as it is not reliable communication to operating surgeons for immediate patient care)

## 2020-06-25 NOTE — Interval H&P Note (Signed)
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  The various methods of treatment have been discussed with the patient and family. After consideration of risks, benefits and other options for treatment, the patient has consented to  Procedure(s) with comments: EXCISION OF DISTAL RECTAL ADENOMATOUS POLYP, HEMORRHOIDAL LIGATION AND PEXY,  HEMORRHOIDECTOMY. ANORECTAL EXAMINATION UNDER ANESTHESIA (N/A) - GEN AND LOCAL as a surgical intervention.  The patient's history has been reviewed, patient examined, no change in status, stable for surgery.  I have reviewed the patient's chart and labs.  Questions were answered to the patient's satisfaction.    I have re-reviewed the the patient's records, history, medications, and allergies.  I have re-examined the patient.  I again discussed intraoperative plans and goals of post-operative recovery.  The patient agrees to proceed.  [name]  02-16-1951 [id number]  Patient Care Team: Lawerance Cruel, MD as PCP - General (Family Medicine) Michael Boston, MD as Consulting Physician (General Surgery) Otis Brace, MD as Consulting Physician (Gastroenterology)  Patient Active Problem List   Diagnosis Date Noted   Arthritis 09/07/2012   Gout 09/07/2012   Inguinal hernia 09/07/2012   History of hemorrhoidectomy 05/20/2011   Neuroma of foot     Past Medical History:  Diagnosis Date   GERD (gastroesophageal reflux disease)    Gout 2009   Hearing loss    Hemorrhoids    History of colonic polyps    Hyperlipidemia    Hypertension    Left shoulder pain    Low back pain    Neuroma of foot    bilateral   Neuropathy    feet and hands   Osteoarthritis    shoulder neck all over   Pre-diabetes    Sinus problem    Tinea versicolor    Wears contact lenses     Past Surgical History:  Procedure  Laterality Date   APPENDECTOMY  1968   arthroscopic knee surgery     x 3 each knee   CARDIAC CATHETERIZATION  2007   indigestion results normal per pt   cervical neck surgery  2018   plate and screws dr Rita Ohara   CHOLECYSTECTOMY  03/12/2004   laparospic   COLONOSCOPY  04/07/2020   polyp removed had total of 9 colonscopies   FOOT BONE EXCISION     bone spurs on left foot   FOOT SURGERY Bilateral    for bone spurs   [place]  2005 and 2009   bilateral   pre skin cancer areas removed from face  03/2019   10-07-2019 spots removed from face and ears   TONSILLECTOMY  1970   UPPER GI ENDOSCOPY  04/07/2020    Social History   Socioeconomic History   Marital status: Married    Spouse name: Not on file   Number of children: Not on file   Years of education: Not on file   Highest education level: Not on file  Occupational History   Not on file  Tobacco Use   Smoking status: Never Smoker   Smokeless tobacco: Never Used  Vaping Use   Vaping Use: Never used  Substance and Sexual Activity   Alcohol use: Yes    Comment: occ beer   Drug use: Never   Sexual activity: Not on file  Other Topics Concern   Not  on file  Social History Narrative   Not on file   Social Determinants of Health   Financial Resource Strain: Not on file  Food Insecurity: Not on file  Transportation Needs: Not on file  Physical Activity: Not on file  Stress: Not on file  Social Connections: Not on file  Intimate Partner Violence: Not on file    Family History  Problem Relation Age of Onset   Diabetes Mother    Heart disease Mother    Other Mother        dementia   Cancer Father        colon   Heart disease Father        heart attack   Diabetes Father    Diabetes Sister    Hypertension Sister    Hypertension Sister    Diabetes Sister    COPD Brother    Heart disease Brother    Other Brother        dementia    Medications Prior to Admission   Medication Sig Dispense Refill Last Dose   acetaminophen (TYLENOL) 650 MG CR tablet Take 650 mg by mouth every 8 (eight) hours as needed for pain.   Past Week at Unknown time   allopurinol (ZYLOPRIM) 300 MG tablet Take 300 mg by mouth daily.   06/25/2020 at 0430   amoxicillin (AMOXIL) 500 MG capsule Take 2,000 mg by mouth See admin instructions. Take 1 hour before dental visits   Past Month at Unknown time   B Complex-C (SUPER B COMPLEX PO) Take by mouth daily.   06/18/2020   Chlorpheniramine-Phenylephrine (SINUS & ALLERGY PO) Take 10 mg by mouth daily. Brand name--- Alloclear   06/25/2020 at 0430   cholecalciferol (VITAMIN D) 1000 units tablet Take 2,000 Units by mouth daily.   06/18/2020   gabapentin (NEURONTIN) 300 MG capsule Take 300 mg by mouth 2 (two) times daily. Takes one capsule in am and two capsules in pm   06/25/2020 at 0430   losartan (COZAAR) 100 MG tablet Take 100 mg by mouth daily.   06/24/2020 at Unknown time   metoprolol tartrate (LOPRESSOR) 50 MG tablet Take 25 mg by mouth 2 (two) times daily.   06/25/2020 at 0430   Multiple Vitamins-Minerals (PRESERVISION AREDS) CAPS Take 1 capsule by mouth 2 (two) times daily.   06/24/2020 at Unknown time   omeprazole (PRILOSEC) 20 MG capsule Take 20 mg by mouth at bedtime. And as needed   06/24/2020 at 1800   simvastatin (ZOCOR) 40 MG tablet Take 40 mg by mouth at bedtime.   06/24/2020 at Unknown time   betamethasone dipropionate (DIPROLENE) 0.05 % cream Apply 1 application topically 2 (two) times daily as needed (rash).   More than a month at Unknown time   colchicine 0.6 MG tablet Take 0.6 mg by mouth daily as needed (gout pain).   More than a month at Unknown time   dextromethorphan-guaiFENesin (ROBITUSSIN-DM) 10-100 MG/5ML liquid Take by mouth every 4 (four) hours as needed for cough. TWO TEASPOONS   More than a month at Unknown time   diclofenac sodium (VOLTAREN) 1 % GEL Apply 2 g topically 4 (four) times daily as needed (pain).   More than a month  at Unknown time   fluconazole (DIFLUCAN) 150 MG tablet Take 150 mg by mouth See admin instructions. Take 1 tablet daily for 4 days for fungus on back and chest   More than a month at Unknown time   fluticasone (FLONASE) 50  MCG/ACT nasal spray Place 1-2 sprays into both nostrils daily as needed for allergies or rhinitis.   06/23/2020   ketoconazole (NIZORAL) 2 % cream Apply 1 application topically daily as needed for irritation.   More than a month at Unknown time   Naproxen Sod-diphenhydrAMINE 220-25 MG TABS Take 2 tablets by mouth at bedtime as needed (pain/sleep).   More than a month at Unknown time   tiZANidine (ZANAFLEX) 4 MG tablet Take 4 mg by mouth every 8 (eight) hours as needed for muscle spasms.   More than a month at Unknown time   Triprolidine-Pseudoephedrine (ANTIHISTAMINE PO) Take 1-2 tablets by mouth every 4 (four) hours as needed. [place]   More than a month at Unknown time    Current Facility-Administered Medications  Medication Dose Route Frequency Provider Last Rate Last Admin   0.9 %  sodium chloride infusion   Intravenous Continuous Nolon Nations, MD 50 mL/hr at 06/25/20 0812 New Bag at 06/25/20 0812   bupivacaine liposome (EXPAREL) 1.3 % injection 266 mg  20 mL Infiltration Once Michael Boston, MD       cefTRIAXone (ROCEPHIN) 2 g in sodium chloride 0.9 % 100 mL IVPB  2 g Intravenous On Call to OR Michael Boston, MD       And   metroNIDAZOLE (FLAGYL) IVPB 500 mg  500 mg Intravenous On Call to OR Michael Boston, MD       Chlorhexidine Gluconate Cloth 2 % PADS 6 each  6 each Topical Once Michael Boston, MD       And   Chlorhexidine Gluconate Cloth 2 % PADS 6 each  6 each Topical Once Michael Boston, MD       Derrill Memo ON 06/26/2020] feeding supplement (ENSURE PRE-SURGERY) liquid 296 mL  296 mL Oral Once Michael Boston, MD       gabapentin (NEURONTIN) capsule 300 mg  300 mg Oral On Call to OR Michael Boston, MD         Allergies  Allergen Reactions   Codeine Rash    Dilaudid [Hydromorphone Hcl] Other (See Comments)    GI upset   Demerol Other (See Comments)    GI upset   Other     Pneumovax 23 shot caused inflammation swelling at site in 2017 shingrix shot redness arm swollen 2018    BP (!) 152/79   Pulse 63   Temp (!) 97.1 F (36.2 C) (Oral)   Resp 17   Ht 6\' 2"  (1.88 m)   Wt 91.8 kg   SpO2 97%   BMI 25.99 kg/m   Labs: No results found for this or any previous visit (from the past 48 hour(s)).  Imaging / Studies: No results found.   Adin Hector, M.D., F.A.C.S. Gastrointestinal and Minimally Invasive Surgery [facility] [address], [place], [place] [address] [phone] Main / Paging  06/25/2020 8:52 AM    Adin Hector

## 2020-06-25 NOTE — Progress Notes (Signed)
Dr Rosendo Gros Paged through office answering service re: pt inability to void, despite multiple attempts since 12pm. Bladder scan showed >936ml x 3 scans.  Pt able to void 300 measurable while awaiting page to on call MD through office.  No new orders, okay to d/c per MD because patient has voided. MD declined need for repeat bladder scan. Advised RN to have patient return to ED if unable to void in next 8 hours. Pt and spouse informed, verbalized understanding. Will proceed with discharge.

## 2020-06-25 NOTE — Op Note (Signed)
06/25/2020  10:54 AM  PATIENT:  [name]  70 y.o. male  Patient Care Team: Lawerance Cruel, MD as PCP - General (Family Medicine) Michael Boston, MD as Consulting Physician (General Surgery) Otis Brace, MD as Consulting Physician (Gastroenterology)  PRE-OPERATIVE DIAGNOSIS:   DISTAL ADENOMATOUS RECTAL POLYP     POST-OPERATIVE DIAGNOSIS:   HEMORRHOIDS PROLAPSING GRADE 3 PROLAPSING ANORECTAL MASS  PROCEDURE:   EXCISION OF DISTAL RECTAL MASS,   HEMORRHOIDAL LIGATION AND PEXY HEMORRHOIDECTOMY ANORECTAL EXAMINATION UNDER ANESTHESIA  SURGEON:  Adin Hector, MD  ANESTHESIA:   General Anorectal & Local field block (0.25% bupivacaine with epinephrine mixed with Liposomal bupivacaine (Experel)   EBL:  Total I/O In: -  Out: 20 [Blood:20].  See operative record  Delay start of Pharmacological VTE agent (>24hrs) due to surgical blood loss or risk of bleeding:  NO  DRAINS: NONE  SPECIMEN:  RIGHT ANTERIOR HEMORRHOID CONTAIINING ADENEOMATOUS TISSUE  Right posterior internal/external hemorrhoid  DISPOSITION OF SPECIMEN:  PATHOLOGY  COUNTS:  YES  PLAN OF CARE: Discharge home after PACU  PATIENT DISPOSITION:  PACU - hemodynamically stable.  INDICATION: Pleasant patient with struggles with hemorrhoids.  History of colon polyps.  Found to have adenomatous prolapsing tissue on what appeared to be a hemorrhoid on the right side.  Not able to be managed in the office despite an improved bowel regimen.  I recommended examination under anesthesia and surgical treatment:  The anatomy & physiology of the anorectal region was discussed.  The pathophysiology of hemorrhoids and differential diagnosis was discussed.  Natural history risks without surgery was discussed.   I stressed the importance of a bowel regimen to have daily soft bowel movements to minimize progression of disease.  Interventions such as sclerotherapy & banding were discussed.  The patient's symptoms  are not adequately controlled by medicines and other non-operative treatments.  I feel the risks & problems of no surgery outweigh the operative risks; therefore, I recommended surgery to treat the hemorrhoids by ligation, pexy, and possible resection.  Risks such as bleeding, infection, need for further treatment, heart attack, death, and other risks were discussed.   I noted a good likelihood this will help address the problem.  Goals of post-operative recovery were discussed as well.  Possibility that this will not correct all symptoms was explained.  Post-operative pain, bleeding, constipation, urinary difficulties, and other problems after surgery were discussed.  We will work to minimize complications.   Educational handouts further explaining the pathology, treatment options, and bowel regimen were given as well.  Questions were answered.  The patient expresses understanding & wishes to proceed with surgery.  OR FINDINGS: Enlarged right anterior hemorrhoidal pile with probable adenomatous changes most likely the site of the concerning adenomatous polyp according gastroenterology.  Right posterior with some scarring and tag consistent with a irritated prolapsing hemorrhoid.  Left lateral grade 2-3 ligation pexy done.  No fissure or fistula or abscess.  Sphincter intact.  Dilates easily.  Redundant rectum but no true prolapse.  No pilonidal disease or condyloma.  DESCRIPTION:   Informed consent was confirmed. Patient underwent general anesthesia without difficulty. Patient was placed into prone positioning.  The perianal region was prepped and draped in sterile fashion. Surgical time-out confirmed our plan.  I did digital rectal examination and then transitioned over to anoscopy to get a sense of the anatomy.  Findings noted above.   I proceeded to do hemorrhoidal ligation and pexy.  I used a 2-0 Vicryl suture on a  UR-6 needle in a figure-of-eight fashion 6 cm proximal to the anal verge.  I  started at the largest pile, right anterior that was suspicious for the site of the polyp.  Right posterior pile much smaller and smooth, not adenomatous or with mucosal changes.. I excised the excess internal hemorrhoid pile with probable polyp longitudinally in a fusiform biconcave fashion, sparing the anal canal to avoid narrowing.  I then ran that stitch longitudinally more distally to close the wound to the anal verge over a large Parks self-retaining anal retractor to avoid narrowing of the anal canal.  I then tied that stitch down to cause a hemorrhoidopexy.   I also had to do an excision at the  right posterior pile locations.  I then did hemorrhoidal ligation and pexy at the other 4 hemorrhoidal columns.  At the completion of this, all 6 anorectal columns were ligated and pexied in the classic hexagonal fashion (right anterior/lateral/posterior, left anterior/lateral/posterior).  I closed the external part of the hemorrhoidectomy wounds with radial interrupted horizontal mattress 2-0 chromic suture over a large Sawyer anal retractor, leaving the last 5 mm open to allow natural drainage.    I redid anoscopy & examination.  At completion of this, all hemorrhoids had been removed or reduced into the rectum.  Since no concerning adenomatous changes.  Clearly the right anterior region was where the prolapsing tissue was.  There is no more prolapse.  Internal & external anatomy was more more normal.  Hemostasis was good.  Fluffed gauze was on-laid over the perianal region.  No packing done.  Patient is being extubated go to go to the recovery room.  I had discussed postop care in detail with the patient in the preop holding area.  Instructions for post-operative recovery and prescriptions are written. I discussed operative findings, updated the patient's status, discussed probable steps to recovery, and gave postoperative recommendations to the patient's spouse, Otila Kluver.  Recommendations were made.  Questions  were answered.  She expressed understanding & appreciation.  Adin Hector, M.D., F.A.C.S. Gastrointestinal and Minimally Invasive Surgery [facility] [address], [place], [place] [address] [phone] Main / Paging

## 2020-06-25 NOTE — Discharge Instructions (Signed)
ANORECTAL SURGERY:  POST OPERATIVE INSTRUCTIONS  ######################################################################  EAT Start with a pureed / full liquid diet After 24 hours, gradually transition to a high fiber diet.    CONTROL PAIN Control pain so you can tolerate bowel movements,  walk, sleep, tolerate sneezing/coughing, and go up/down stairs.   HAVE A BOWEL MOVEMENT DAILY Keep your bowels regular to avoid problems.   Taking a fiber supplement every day to keep bowels soft.   Try a laxative to override constipation. Use an antidairrheal to slow down diarrhea.   Call if not better after 2 tries  WALK Walk an hour a day.  Control your pain to do that.   CALL IF YOU HAVE PROBLEMS/CONCERNS Call if you are still struggling despite following these instructions. Call if you have concerns not answered by these instructions  ######################################################################    1. Take your usually prescribed home medications unless otherwise directed.  2. DIET: Follow a light bland diet & liquids the first 24 hours after arrival home, such as soup, liquids, starches, etc.  Be sure to drink plenty of fluids.  Quickly advance to a usual solid diet within a few days.  Avoid fast food or heavy meals as your are more likely to get nauseated or have irregular bowels.  A low-fat, high-fiber diet for the rest of your life is ideal.  3. PAIN CONTROL: a. Pain is best controlled by a usual combination of three different methods TOGETHER: i. Ice/Heat ii. Over the counter pain medication iii. Prescription pain medication b. Expect swelling and discomfort in the anus/rectal area.  Warm water baths (30-60 minutes up to 6 times a day, especially after bowel meovements) will help. Use ice for the first few days to help decrease swelling and bruising, then switch to heat such as warm towels, sitz baths, warm baths, etc to help relax tight/sore spots and speed recovery.   Some people prefer to use ice alone, heat alone, alternating between ice & heat.  Experiment to what works for you.   c. It is helpful to take an over-the-counter pain medication continuously for the first few weeks.  Choose one of the following that works best for you: i. Naproxen (Aleve, etc)  Two 250m tabs twice a day ii. Ibuprofen (Advil, etc) Three 2072mtabs four times a day (every meal & bedtime) iii. Acetaminophen (Tylenol, etc) 500-65044mour times a day (every meal & bedtime) d. A  prescription for pain medication (such as oxycodone, hydrocodone, etc) should be given to you upon discharge.  Take your pain medication as prescribed.  i. If you are having problems/concerns with the prescription medicine (does not control pain, nausea, vomiting, rash, itching, etc), please call us [phone] see if we need to switch you to a different pain medicine that will work better for you and/or control your side effect better. ii. If you need a refill on your pain medication, please contact your pharmacy.  They will contact our office to request authorization. Prescriptions will not be filled after 5 pm or on week-ends.  If can take up to 48 hours for it to be filled & ready so avoid waiting until you are down to thel ast pill. e. A topical cream (Dibucaine) or a prescription for a cream (such as diltiazem 2% gel) may be given to you.  Many people find relief with topical creams.  Some people find it burns too much.  Experiment.  If it helps, use it.  If it burns, don't using  it.  Use a Sitz Bath 4-8 times a day for relief   [employer] A sitz bath is a warm water bath taken in the sitting position that covers only the hips and buttocks. It may be used for either healing or hygiene purposes. Sitz baths are also used to relieve pain, itching, or muscle spasms. The water may contain medicine. Moist heat will help you heal and relax.  HOME CARE INSTRUCTIONS  Take 3 to 4 sitz baths a day. 1. Fill the  bathtub half full with warm water. 2. Sit in the water and open the drain a little. 3. Turn on the warm water to keep the tub half full. Keep the water running constantly. 4. Soak in the water for 15 to 20 minutes. 5. After the sitz bath, pat the affected area dry first.   4. KEEP YOUR BOWELS REGULAR a. The goal is one soft bowel movement a day b. Avoid getting constipated.  Between the surgery and the pain medications, it is common to experience some constipation.  Increasing fluid intake and taking a fiber supplement (such as Metamucil, Citrucel, FiberCon, MiraLax, etc) 2-3 times a day regularly will usually help prevent this problem from occurring.  A mild laxative (prune juice, Milk of Magnesia, MiraLax, etc) should be taken according to package directions if there are no bowel movements after 48 hours. c. Watch out for diarrhea.  If you have many loose bowel movements, simplify your diet to bland foods & liquids for a few days.  Stop any stool softeners and decrease your fiber supplement.  Switching to mild anti-diarrheal medications (Kayopectate, Pepto Bismol) can help.  Can try an imodium/loperamide dose.  If this worsens or does not improve, please call us.  5. Wound Care  a. Remove your bandages with your first bowel movement, usually the day after surgery.  Let the gauze fall off with the first bowel movement or shower.   b. Wear an absorbent pad or soft cotton balls in your underwear as needed to catch any drainage and help keep the area  c. Keep the area clean and dry.  Bathe / shower every day.  Keep the area clean by showering / bathing over the incision / wound.   It is okay to soak an open wound to help wash it.  Consider using a squeeze bottle filled with warm water to gently wash the anal area.  Wet wipes or showers / gentle washing after bowel movements is often less traumatic than regular toilet paper. d. Akari Bast will often notice bleeding with bowel movements.  This should slow down  by the end of the first week of surgery.  Sitting on an ice pack can help. e. Expect some drainage.  This should slow down by the end of the first week of surgery, but you will have occasional bleeding or drainage up to a few months after surgery.  Wear an absorbent pad or soft cotton gauze in your underwear until the drainage stops.  6. ACTIVITIES as tolerated:   a. You may resume regular (light) daily activities beginning the next day--such as daily self-care, walking, climbing stairs--gradually increasing activities as tolerated.  If you can walk 30 minutes without difficulty, it is safe to try more intense activity such as jogging, treadmill, bicycling, low-impact aerobics, swimming, etc. b. Save the most intensive and strenuous activity for last such as sit-ups, heavy lifting, contact sports, etc  Refrain from any heavy lifting or straining until you are off narcotics for  pain control.   c. DO NOT PUSH THROUGH PAIN.  Let pain be your guide: If it hurts to do something, don't do it.  Pain is your body warning you to avoid that activity for another week until the pain goes down. d. You may drive when you are no longer taking prescription pain medication, you can comfortably sit for long periods of time, and you can safely maneuver your car and apply brakes. e. Barre Bast may have sexual intercourse when it is comfortable.  7. FOLLOW UP in our office a. Please call [facility] at [phone] to set up an appointment to see your surgeon in the office for a follow-up appointment approximately 2-3 weeks after your surgery. b. Make sure that you call for this appointment the day you arrive home to ensure a convenient appointment time.  8. IF YOU HAVE DISABILITY OR FAMILY LEAVE FORMS, BRING THEM TO THE OFFICE FOR PROCESSING.  DO NOT GIVE THEM TO YOUR DOCTOR.        WHEN TO CALL us [phone]: 1. Poor pain control 2. Reactions / problems with new medications (rash/itching, nausea, etc)  3. Fever over  101.5 F (38.5 C) 4. Inability to urinate 5. Nausea and/or vomiting 6. Worsening swelling or bruising 7. Continued bleeding from incision. 8. Increased pain, redness, or drainage from the incision  The clinic staff is available to answer your questions during regular business hours (8:30am-5pm).  Please don't hesitate to call and ask to speak to one of our nurses for clinical concerns.   A surgeon from [facility] is always on call at the hospitals   If you have a medical emergency, go to the nearest emergency room or call 911.    [facility], [place], [place], [place], [place]  [address] ? MAIN: [phone] ? TOLL FREE: [phone] ? FAX [phone] [email or web address]     HEMORRHOIDS  The rectum is the last foot of your colon, and it naturally stretches to hold stool.  Hemorrhoidal piles are natural clusters of blood vessels that help the rectum and anal canal stretch to hold stool and allow bowel movements to eliminate feces.   Hemorrhoids are abnormally swollen blood vessels in the rectum.  Too much pressure in the rectum causes hemorrhoids by forcing blood to stretch and bulge the walls of the veins, sometimes even rupturing them.  Hemorrhoids can become like varicose veins you might see on a person's legs.  Most people will develop a flare of hemorrhoids in their lifetime.  When bulging hemorrhoidal veins are irritated, they can swell, burn, itch, cause pain, and bleed.  Most flares will calm down gradually own within a few weeks.  However, once hemorrhoids are created, they are difficult to get rid of completely and tend to flare more easily than the first flare.   Fortunately, good habits and simple medical treatment usually control hemorrhoids well, and surgery is needed only in severe cases.  Types of Hemorrhoids:  Internal hemorrhoids usually don't initially hurt or itch; they are deep inside the rectum and usually  have no sensation. If they begin to push out (prolapse), pain and burning can occur.  However, internal hemorrhoids can bleed.  Anal bleeding should not be ignored since bleeding could come from a dangerous source like colorectal cancer, so persistent rectal bleeding should be investigated by a doctor, sometimes with a colonoscopy.  External hemorrhoids cause most of the symptoms - pain, burning, and itching. Nonirritated hemorrhoids can  look like small skin tags coming out of the anus.   Thrombosed hemorrhoids can form when a hemorrhoid blood vessel bursts and causes the hemorrhoid to suddenly swell.  A purple blood clot can form in it and become an excruciatingly painful lump at the anus. Because of these unpleasant symptoms, immediate incision and drainage by a surgeon at an office visit can provide much relief of the pain.    PREVENTION Avoiding the most frequent causes listed below will prevent most cases of hemorrhoids: Constipation Hard stools Diarrhea  Constant sitting  Straining with bowel movements Sitting on the toilet for a long time  Severe coughing  episodes Pregnancy / Childbirth  Heavy Lifting   Sometimes avoiding the above triggers is difficult:  How can you avoid sitting all day if you have a seated job? Also, we try to avoid coughing and diarrhea, but sometimes it's beyond your control.  Still, there are some practical hints to help: Keep the anal and genital area clean.  Moistened tissues such as flushable wet wipes are less irritating than toilet paper.  Using irrigating showers or bottle irrigation washing gently cleans this sensitive area.   Avoid dry toilet paper when cleaning after bowel movements.  Marland Kitchen Keep the anal and genital area dry.  Lightly pat the rectal area dry.  Avoid rubbing.  Use cotton pads/balls to gently absorb moisture/leaking.  Talcum or baby powders can help GET YOUR STOOLS SOFT.   This is the most important way to prevent irritated hemorrhoids.  Hard  stools are like sandpaper to the anorectal canal and will cause more problems.    The goal: ONE SOFT BOWEL MOVEMENT A DAY!   BMs from every other day to three times a day is a tolerable range  Treat coughing, diarrhea and constipation early since irritated hemorrhoids may soon follow.   If your main job activity is seated, always stand or walk during your breaks. Make it a point to stand and walk at least 5 minutes every hour and try to shift frequently in your chair to avoid direct rectal pressure.   Always exhale as you strain or lift. Don't hold your breath.   Do not delay or try to prevent a bowel movement when the urge is present.  Exercise regularly (walking or jogging 60 minutes a day) to stimulate the bowels to move.  No reading or other activity while on the toilet. If bowel movements take longer than 5 minutes, you are too constipated.  AVOID CONSTIPATION  Drink plenty of liquids (1 1/2 to 2 quarts of water and other fluids a day unless fluid restricted for another medical condition). Liquids that contain caffeine (coffee a, tea, soft drinks) can be dehydrating and should be avoided until constipation is controlled.  Consider minimizing milk, as dairy products may be constipating.  Eat plenty of fiber (30g a day ideal, more if needed).  Fiber is the undigested part of plant food that passes into the colon, acting as "natures broom" to encourage bowel motility and movement.  Fiber can absorb and hold large amounts of water. This results in a larger, bulkier stool, which is soft and easier to pass.  Eating foods high in fiber - 12 servings - such as  Vegetables: Root (potatoes, carrots, turnips), Leafy green (lettuce, salad greens, celery, spinach), High residue (cabbage, broccoli, etc.) Fruit: Fresh, Dried (prunes, apricots, cherries), Stewed (applesauce)  Whole grain breads, pasta, whole wheat Bran cereals, muffins, etc.  TAKE A FIBER SUPPLEMENT EVERY DAY  -  supplemental  bulking fiber retains large volumes of water& soften stools: Psyllium ground seeds (native plant from [place])--available as Metamucil, Konsyl, Effersyllium, Per Diem Fiber, or the less expensive generic forms.  Citrucel  (methylcellulose wood fiber) . FiberCon (Polycarbophil) Polyethylene Glycol - and "artificial" fiber commonly called Miralax or Glycolax.  It is helpful for people with gassy or bloated feelings with regular fiber Flax Seed - a less gassy natural fiber  Laxatives can be useful for a short period if constipation is severe Osmotics (Milk of Magnesia, Fleets Phospho-Soda, Magnesium Citrate)  Stimulants (Senokot,   Castor Oil,  Dulcolax, Ex-Lax)    Laxatives are not a good long-term solution as it can stress the bowels and cause too much mineral loss and dehydration.   Avoid taking laxatives for more than 7 days in a row.  CONTROLLING DIARRHEA  o TAKE A FIBER SUPPLEMENT (FiberCon or Benefiner soluble fiber) twice a day - to thicken stools by absorbing excess fluid and retrain the intestines to act more normally.  Slowly increase the dose over a few weeks.  Too much fiber too soon can backfire and cause cramping & bloating. o  o TAKE AN IRON SUPPLEMENT twice a day to naturally constipate your bowels.  Usually ferrous sulfate 325mg  twice a day)  o TAKE ANTI-DIARRHEAL MEDICINES: - Loperamide (Imodium) can slow down diarrhea.  Start with two tablets (= 4mg ) first and then try one tablet every 6 hours.  Can go up to 2 pills four times day (8 pills of 2mg  max) Avoid if you are having fevers or severe pain.  If you are not better or start feeling worse, stop all medicines and call your doctor for advice - LoMotil (Diphenoxylate / Atropine) is another medicine that can constipate & slow down bowel moevements - Pepto Bismol (bismuth) can gently thicken bowels as well  o If diarrhea is worse,: - drink plenty of liquids and try simpler foods for a few days to avoid stressing your  intestines further. - Avoid dairy products (especially milk & ice cream) for a short time.  The intestines often can lose the ability to digest lactose when stressed. - Avoid foods that cause gassiness or bloating.  Typical foods include beans and other legumes, cabbage, broccoli, and dairy foods.  Every person has some sensitivity to other foods, so listen to our body and avoid those foods that trigger problems for you.Call your doctor if you are getting worse or not better.  Sometimes further testing (cultures, endoscopy, X-ray studies, bloodwork, etc) may be needed to help diagnose and treat the cause of the diarrhea. - Take extra anti-diarrheal medicines (maximum is 8 pills of 2mg  loperamide a day)  TROUBLESHOOTING IRREGULAR BOWELS 1) Avoid extremes of bowel movements (no bad constipation/diarrhea) 2) Miralax 17gm mixed in 8oz. water or juice-daily. May use BID as needed.  3) Gas-x,Phazyme, etc. as needed for gas & bloating.  4) Soft,bland diet. No spicy,greasy,fried foods.  5) Prilosec over-the-counter as needed  6) May hold gluten/wheat products from diet to see if symptoms improve.  7)  May try probiotics (Align, Activa, etc) to help calm the bowels down 7) If symptoms become worse call back immediately.   TREATMENT OF HEMORRHOID FLARE If these preventive measures fail, you must take action right away! Hemorrhoids are one condition that can be mild in the morning and become intolerable by nightfall. Most hemorrhoidal flares take several weeks to calm down.  These suggestions can help: Warm soaks.  This helps more than  any topical medication.  Use up to 8 times a day.  Usually sitz baths or sitting in a warm bathtub helps.  Sitting on moist warm towels are helpful.  Switching to ice packs/cool compresses can be helpful  Use a Sitz Bath 4-8 times a day for relief A sitz bath is a warm water bath taken in the sitting position that covers only the hips and buttocks. It may be used for either  healing or hygiene purposes. Sitz baths are also used to relieve pain, itching, or muscle spasms. The water may contain medicine. Moist heat will help you heal and relax.  HOME CARE INSTRUCTIONS  Take 3 to 4 sitz baths a day. 6. Fill the bathtub half full with warm water. 7. Sit in the water and open the drain a little. 8. Turn on the warm water to keep the tub half full. Keep the water running constantly. 9. Soak in the water for 15 to 20 minutes. 10. After the sitz bath, pat the affected area dry first. SEEK MEDICAL CARE IF:  You get worse instead of better. Stop the sitz baths if you get worse.  Normalize your bowels.  Extremes of diarrhea or constipation will make hemorrhoids worse.  One soft bowel movement a day is the goal.  Fiber can help get your bowels regular Wet wipes instead of toilet paper Pain control with a NSAID such as ibuprofen (Advil) or naproxen (Aleve) or acetaminophen (Tylenol) around the clock.  Narcotics are constipating and should be minimized if possible Topical creams contain steroids (bydrocortisone) or local anesthetic (xylocaine) can help make pain and itching more tolerable.   EVALUATION If hemorrhoids are still causing problems, you could benefit by an evaluation by a surgeon.  The surgeon will obtain a history and examine you.  If hemorrhoids are diagnosed, some therapies can be offered in the office, usually with an anoscope into the less sensitive area of the rectum: -injection of hemorrhoids (sclerotherapy) can scar the blood vessels of the swollen/enlarged hemorrhoids to help shrink them down to a more normal size -rubber banding of the enlarged hemorrhoids to help shrink them down to a more normal size -drainage of the blood clot causing a thrombosed hemorrhoid,  to relieve the severe pain   While 90% of the time such problems from hemorrhoids can be managed without preceding to surgery, sometimes the hemorrhoids require a operation to control the problem  (uncontrolled bleeding, prolapse, pain, etc.).   This involves being placed under general anesthesia where the surgeon can confirm the diagnosis and remove, suture, or staple the hemorrhoid(s).  Your surgeon can help you treat the problem appropriately.    Information for Discharge Teaching: EXPAREL (bupivacaine liposome injectable suspension)   Your surgeon or anesthesiologist gave you EXPAREL(bupivacaine) to help control your pain after surgery.   EXPAREL is a local anesthetic that provides pain relief by numbing the tissue around the surgical site.  EXPAREL is designed to release pain medication over time and can control pain for up to 72 hours.  Depending on how you respond to EXPAREL, you may require less pain medication during your recovery.  Possible side effects:  Temporary loss of sensation or ability to move in the area where bupivacaine was injected.  Nausea, vomiting, constipation  Rarely, numbness and tingling in your mouth or lips, lightheadedness, or anxiety may occur.  Call your doctor right away if you think you may be experiencing any of these sensations, or if you have other questions regarding possible  side effects.  Follow all other discharge instructions given to you by your surgeon or nurse. Eat a healthy diet and drink plenty of water or other fluids.  If you return to the hospital for any reason within 96 hours following the administration of EXPAREL, it is important for health care providers to know that you have received this anesthetic. A teal colored band has been placed on your arm with the date, time and amount of EXPAREL you have received in order to alert and inform your health care providers. Please leave this armband in place for the full 96 hours following administration, and then you may remove the band. Post Anesthesia Home Care Instructions  Activity: Get plenty of rest for the remainder of the day. A responsible individual must stay with you for 24  hours following the procedure.  For the next 24 hours, DO NOT: -Drive a car -Paediatric nurse -Drink alcoholic beverages -Take any medication unless instructed by your physician -Make any legal decisions or sign important papers.  Meals: Start with liquid foods such as gelatin or soup. Progress to regular foods as tolerated. Avoid greasy, spicy, heavy foods. If nausea and/or vomiting occur, drink only clear liquids until the nausea and/or vomiting subsides. Call your physician if vomiting continues.  Special Instructions/Symptoms: Your throat may feel dry or sore from the anesthesia or the breathing tube placed in your throat during surgery. If this causes discomfort, gargle with warm salt water. The discomfort should disappear within 24 hours.  If you had a scopolamine patch placed behind your ear for the management of post- operative nausea and/or vomiting:  1. The medication in the patch is effective for 72 hours, after which it should be removed.  Wrap patch in a tissue and discard in the trash. Wash hands thoroughly with soap and water. 2. You may remove the patch earlier than 72 hours if you experience unpleasant side effects which may include dry mouth, dizziness or visual disturbances. 3. Avoid touching the patch. Wash your hands with soap and water after contact with the patch.

## 2020-06-25 NOTE — Anesthesia Preprocedure Evaluation (Addendum)
Anesthesia Evaluation  Patient identified by MRN, date of birth, ID band Patient awake    Reviewed: Allergy & Precautions, H&P , NPO status , Patient's Chart, lab work & pertinent test results, reviewed documented beta blocker date and time   Airway Mallampati: I  TM Distance: >3 FB Neck ROM: full    Dental no notable dental hx. (+) Teeth Intact, Caps, Dental Advisory Given,    Pulmonary    Pulmonary exam normal breath sounds clear to auscultation       Cardiovascular Exercise Tolerance: Good hypertension, Pt. on medications and Pt. on home beta blockers  Rhythm:regular Rate:Normal     Neuro/Psych    GI/Hepatic GERD  Medicated and Controlled,  Endo/Other    Renal/GU      Musculoskeletal  (+) Arthritis , Osteoarthritis,    Abdominal   Peds  Hematology   Anesthesia Other Findings   Reproductive/Obstetrics                            Anesthesia Physical Anesthesia Plan  ASA: III  Anesthesia Plan: General   Post-op Pain Management:    Induction: Intravenous  PONV Risk Score and Plan: 2 and Ondansetron, Dexamethasone and Treatment may vary due to age or medical condition  Airway Management Planned: Oral ETT and LMA  Additional Equipment: None  Intra-op Plan:   Post-operative Plan: Extubation in OR  Informed Consent: I have reviewed the patients History and Physical, chart, labs and discussed the procedure including the risks, benefits and alternatives for the proposed anesthesia with the patient or authorized representative who has indicated his/her understanding and acceptance.     Dental Advisory Given  Plan Discussed with: CRNA and Anesthesiologist  Anesthesia Plan Comments: (  )        Anesthesia Quick Evaluation

## 2020-06-25 NOTE — Anesthesia Procedure Notes (Signed)
Procedure Name: Intubation Date/Time: 06/25/2020 9:36 AM Performed by: Justice Rocher, CRNA Pre-anesthesia Checklist: Patient identified, Emergency Drugs available, Suction available, Patient being monitored and Timeout performed Patient Re-evaluated:Patient Re-evaluated prior to induction Oxygen Delivery Method: Circle system utilized Preoxygenation: Pre-oxygenation with 100% oxygen Induction Type: IV induction Ventilation: Mask ventilation without difficulty Laryngoscope Size: Mac and 4 Grade View: Grade II Tube type: Oral Tube size: 7.5 mm Number of attempts: 1 Airway Equipment and Method: Stylet and Oral airway Placement Confirmation: ETT inserted through vocal cords under direct vision,  positive ETCO2,  breath sounds checked- equal and bilateral and CO2 detector Secured at: 23 cm Tube secured with: Tape Dental Injury: Teeth and Oropharynx as per pre-operative assessment

## 2020-06-25 NOTE — Interval H&P Note (Signed)
History and Physical Interval Note:  06/25/2020 7:53 AM  [name]  has presented today for surgery, with the diagnosis of DISTAL ADENOMATOUS RECTAL POLYP  HEMORRHOIDS PROLAPSING GRADE 3.  The various methods of treatment have been discussed with the patient and family. After consideration of risks, benefits and other options for treatment, the patient has consented to  Procedure(s) with comments: EXCISION OF DISTAL RECTAL ADENOMATOUS POLYP, HEMORRHOIDAL LIGATION AND PEXY,  HEMORRHOIDECTOMY. ANORECTAL EXAMINATION UNDER ANESTHESIA (N/A) - GEN AND LOCAL as a surgical intervention.  The patient's history has been reviewed, patient examined, no change in status, stable for surgery.  I have reviewed the patient's chart and labs.  Questions were answered to the patient's satisfaction.    I have re-reviewed the the patient's records, history, medications, and allergies.  I have re-examined the patient.  I again discussed intraoperative plans and goals of post-operative recovery.  The patient agrees to proceed.  [name]  November 18, 1950 [id number]  Patient Care Team: Lawerance Cruel, MD as PCP - General (Family Medicine) Michael Boston, MD as Consulting Physician (General Surgery) Otis Brace, MD as Consulting Physician (Gastroenterology)  Patient Active Problem List   Diagnosis Date Noted   Arthritis 09/07/2012   Gout 09/07/2012   Inguinal hernia 09/07/2012   History of hemorrhoidectomy 05/20/2011   Neuroma of foot     Past Medical History:  Diagnosis Date   GERD (gastroesophageal reflux disease)    Gout 2009   Hearing loss    Hemorrhoids    History of colonic polyps    Hyperlipidemia    Hypertension    Left shoulder pain    Low back pain    Neuroma of foot    bilateral   Neuropathy    feet and hands   Osteoarthritis    shoulder neck all over   Pre-diabetes    Sinus problem    Tinea versicolor    Wears contact lenses     Past Surgical History:  Procedure  Laterality Date   APPENDECTOMY  1968   arthroscopic knee surgery     x 3 each knee   CARDIAC CATHETERIZATION  2007   indigestion results normal per pt   cervical neck surgery  2018   plate and screws dr Rita Ohara   CHOLECYSTECTOMY  03/12/2004   laparospic   COLONOSCOPY  04/07/2020   polyp removed had total of 9 colonscopies   FOOT BONE EXCISION     bone spurs on left foot   FOOT SURGERY Bilateral    for bone spurs   [place]  2005 and 2009   bilateral   pre skin cancer areas removed from face  03/2019   10-07-2019 spots removed from face and ears   TONSILLECTOMY  1970   UPPER GI ENDOSCOPY  04/07/2020    Social History   Socioeconomic History   Marital status: Married    Spouse name: Not on file   Number of children: Not on file   Years of education: Not on file   Highest education level: Not on file  Occupational History   Not on file  Tobacco Use   Smoking status: Never Smoker   Smokeless tobacco: Never Used  Vaping Use   Vaping Use: Never used  Substance and Sexual Activity   Alcohol use: Yes    Comment: occ beer   Drug use: Never   Sexual activity: Not on file  Other Topics Concern   Not  on file  Social History Narrative   Not on file   Social Determinants of Health   Financial Resource Strain: Not on file  Food Insecurity: Not on file  Transportation Needs: Not on file  Physical Activity: Not on file  Stress: Not on file  Social Connections: Not on file  Intimate Partner Violence: Not on file    Family History  Problem Relation Age of Onset   Diabetes Mother    Heart disease Mother    Other Mother        dementia   Cancer Father        colon   Heart disease Father        heart attack   Diabetes Father    Diabetes Sister    Hypertension Sister    Hypertension Sister    Diabetes Sister    COPD Brother    Heart disease Brother    Other Brother        dementia    Medications Prior to Admission   Medication Sig Dispense Refill Last Dose   acetaminophen (TYLENOL) 650 MG CR tablet Take 650 mg by mouth every 8 (eight) hours as needed for pain.      amoxicillin (AMOXIL) 500 MG capsule Take 2,000 mg by mouth See admin instructions. Take 1 hour before dental visits      B Complex-C (SUPER B COMPLEX PO) Take by mouth daily.      Chlorpheniramine-Phenylephrine (SINUS & ALLERGY PO) Take 10 mg by mouth daily. Brand name--- Alloclear      cholecalciferol (VITAMIN D) 1000 units tablet Take 2,000 Units by mouth daily.      dextromethorphan-guaiFENesin (TUSSIN DM) 10-100 MG/5ML liquid Take by mouth every 4 (four) hours as needed for cough. TWO TEASPOONS      gabapentin (NEURONTIN) 300 MG capsule Take 300 mg by mouth 2 (two) times daily. Takes one capsule in am and two capsules in pm      losartan (COZAAR) 100 MG tablet Take 100 mg by mouth daily.      metoprolol tartrate (LOPRESSOR) 50 MG tablet Take 50 mg by mouth 2 (two) times daily.      Multiple Vitamins-Minerals (PRESERVISION AREDS) CAPS Take 1 capsule by mouth 2 (two) times daily.      omeprazole (PRILOSEC) 20 MG capsule Take 20 mg by mouth at bedtime. And as needed      tiZANidine (ZANAFLEX) 4 MG tablet Take 4 mg by mouth every 8 (eight) hours as needed for muscle spasms.      Triprolidine-Pseudoephedrine (ANTIHISTAMINE PO) Take 1-2 tablets by mouth every 4 (four) hours as needed. [employer]      allopurinol (ZYLOPRIM) 300 MG tablet Take 300 mg by mouth daily.      betamethasone dipropionate (DIPROLENE) 0.05 % cream Apply 1 application topically 2 (two) times daily as needed (rash).      colchicine 0.6 MG tablet Take 0.6 mg by mouth daily as needed (gout pain).      diclofenac sodium (VOLTAREN) 1 % GEL Apply 2 g topically 4 (four) times daily as needed (pain).      fluconazole (DIFLUCAN) 150 MG tablet Take 150 mg by mouth See admin instructions. Take 1 tablet daily for 4 days for fungus on back and chest      fluticasone (FLONASE) 50  MCG/ACT nasal spray Place 1-2 sprays into both nostrils daily as needed for allergies or rhinitis.      ketoconazole (NIZORAL) 2 % cream Apply  1 application topically daily as needed for irritation.      Naproxen Sod-diphenhydrAMINE 220-25 MG TABS Take 2 tablets by mouth at bedtime as needed (pain/sleep).      simvastatin (ZOCOR) 40 MG tablet Take 40 mg by mouth at bedtime.       Current Facility-Administered Medications  Medication Dose Route Frequency Provider Last Rate Last Admin   0.9 %  sodium chloride infusion   Intravenous Continuous Nolon Nations, MD       acetaminophen (TYLENOL) tablet 1,000 mg  1,000 mg Oral On Call to OR Michael Boston, MD       bupivacaine liposome (EXPAREL) 1.3 % injection 266 mg  20 mL Infiltration Once Michael Boston, MD       cefTRIAXone (ROCEPHIN) 2 g in sodium chloride 0.9 % 100 mL IVPB  2 g Intravenous On Call to OR Michael Boston, MD       And   metroNIDAZOLE (FLAGYL) IVPB 500 mg  500 mg Intravenous On Call to OR Michael Boston, MD       Chlorhexidine Gluconate Cloth 2 % PADS 6 each  6 each Topical Once Michael Boston, MD       And   Chlorhexidine Gluconate Cloth 2 % PADS 6 each  6 each Topical Once Michael Boston, MD       Derrill Memo ON 06/26/2020] feeding supplement (ENSURE PRE-SURGERY) liquid 296 mL  296 mL Oral Once Michael Boston, MD       gabapentin (NEURONTIN) capsule 300 mg  300 mg Oral On Call to OR Michael Boston, MD         Allergies  Allergen Reactions   Codeine Rash   Dilaudid [Hydromorphone Hcl] Other (See Comments)    GI upset   Demerol Other (See Comments)    GI upset   Other     Pneumovax 23 shot caused inflammation swelling at site in 2017 shingrix shot redness arm swollen 2018    Ht 6\' 2"  (1.88 m)   Wt 92.1 kg   BMI 26.06 kg/m   Labs: No results found for this or any previous visit (from the past 69 hour(s)).  Imaging / Studies: No results found.   Adin Hector, M.D., F.A.C.S. Gastrointestinal and Minimally Invasive  Surgery [facility] [address], [place], [place] [address] [phone] Main / Paging  06/25/2020 7:54 AM     Adin Hector

## 2020-06-25 NOTE — Anesthesia Postprocedure Evaluation (Signed)
Anesthesia Post Note  Patient: [name]  Procedure(s) Performed: EXCISION OF DISTAL RECTAL MASS,  HEMORRHOIDAL LIGATION AND PEXY,  HEMORRHOIDECTOMY. ANORECTAL EXAMINATION UNDER ANESTHESIA (N/A Rectum)     Patient location during evaluation: PACU Anesthesia Type: General Level of consciousness: awake and alert Pain management: pain level controlled Vital Signs Assessment: post-procedure vital signs reviewed and stable Respiratory status: spontaneous breathing, nonlabored ventilation, respiratory function stable and patient connected to nasal cannula oxygen Cardiovascular status: blood pressure returned to baseline and stable Postop Assessment: no apparent nausea or vomiting Anesthetic complications: no   No complications documented.  Last Vitals:  Vitals:   06/25/20 1130 06/25/20 1145  BP: (!) 150/83 (!) 156/87  Pulse: 69 66  Resp: (!) 9 13  Temp:  36.5 C  SpO2: 98% 97%    Last Pain:  Vitals:   06/25/20 1055  TempSrc:   PainSc: 0-No pain                 Kacey Vicuna

## 2020-06-26 ENCOUNTER — Emergency Department (HOSPITAL_BASED_OUTPATIENT_CLINIC_OR_DEPARTMENT_OTHER)
Admission: EM | Admit: 2020-06-26 | Discharge: 2020-06-26 | Disposition: A | Discharge status: Home / Self Care | Payer: Medicare Other | Attending: Emergency Medicine | Admitting: Emergency Medicine

## 2020-06-26 ENCOUNTER — Encounter (HOSPITAL_BASED_OUTPATIENT_CLINIC_OR_DEPARTMENT_OTHER): Payer: Self-pay

## 2020-06-26 ENCOUNTER — Other Ambulatory Visit: Payer: Self-pay

## 2020-06-26 DIAGNOSIS — R339 Retention of urine, unspecified: Secondary | ICD-10-CM | POA: Insufficient documentation

## 2020-06-26 DIAGNOSIS — Z96653 Presence of artificial knee joint, bilateral: Secondary | ICD-10-CM | POA: Insufficient documentation

## 2020-06-26 DIAGNOSIS — I10 Essential (primary) hypertension: Secondary | ICD-10-CM | POA: Diagnosis not present

## 2020-06-26 DIAGNOSIS — Z79899 Other long term (current) drug therapy: Secondary | ICD-10-CM | POA: Diagnosis not present

## 2020-06-26 LAB — URINALYSIS, ROUTINE W REFLEX MICROSCOPIC
Bilirubin Urine: NEGATIVE
Glucose, UA: NEGATIVE mg/dL
Hgb urine dipstick: NEGATIVE
Ketones, ur: NEGATIVE mg/dL
Leukocytes,Ua: NEGATIVE
Nitrite: NEGATIVE
Protein, ur: NEGATIVE mg/dL
Specific Gravity, Urine: 1.01 (ref 1.005–1.030)
pH: 6.5 (ref 5.0–8.0)

## 2020-06-26 LAB — SURGICAL PATHOLOGY

## 2020-06-26 NOTE — ED Notes (Signed)
Foley inserted by Rennie Natter, I chaperoned procedure. Clicked off order also

## 2020-06-26 NOTE — Discharge Instructions (Signed)
1.  You can continue to manage your catheter per instructions.  See your doctor on Monday for evaluation and probable removal of your catheter. 2.  A culture was done of your urine.  Results should be available in 1 to 2 days.  If your urine shows signs of infection, you will be called to start antibiotics. 3.  Return to the emergency department if you have any problems with your catheter, pain fever chills or worsening condition.

## 2020-06-26 NOTE — ED Notes (Signed)
AVS provided to client and wife, pt teaching done on urinary catheter care, how to maintain urinary drainage bag, also how to empty and record output from urinary drainage system. Education on Stat-Lock also provided. Additional mesh undergarments with abd pads provided for pt from being post op of rectal surgery. Instructions also provided on contacting MD re: for need of urinary catheter due to acute urinary retention. Opportunity for questions provided. Pts gait steady and req min assistance to ambulate to DC Registration desk.

## 2020-06-26 NOTE — ED Triage Notes (Signed)
Patient had surgery yesterday unable to void now.

## 2020-06-26 NOTE — ED Notes (Signed)
Verbal order given for foley by MD r/t bladder scan amount of >999

## 2020-06-26 NOTE — ED Provider Notes (Signed)
[facility] EMERGENCY DEPARTMENT Provider Note   CSN: [id number] Arrival date & time: 06/26/20  1000     History Chief Complaint  Patient presents with  . Urinary Retention    [name] is a 70 y.o. male.  HPI Patient had rectal polypectomy and hemorrhoid banding by Dr. Johney Maine yesterday.  Patient reports he left the hospital late yesterday evening.  He had not been urinating very much.  He reports he went through the night and was not putting out urine.  By this morning he was having a lot of pressure and pain in the lower abdomen.  Patient had no significant history of urinary retention.  He reports he did however have urinary retention postoperatively in his 56s after appendectomy.  No fevers no chills.  Reports he is having some rectal discomfort postoperatively but not severe, he has not yet had a bowel movement.  He has a fiber supplement and stool softener which she will be starting today.    Past Medical History:  Diagnosis Date  . GERD (gastroesophageal reflux disease)   . Gout 2009  . Hearing loss   . Hemorrhoids   . History of colonic polyps   . Hyperlipidemia   . Hypertension   . Left shoulder pain   . Low back pain   . Neuroma of foot    bilateral  . Neuropathy    feet and hands  . Osteoarthritis    shoulder neck all over  . Pre-diabetes   . Sinus problem   . Tinea versicolor   . Wears contact lenses     Patient Active Problem List   Diagnosis Date Noted  . 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 Prolapsed internal hemorrhoids, grade 3, s/p ligation/pexy 06/25/2020 06/25/2020  . Arthritis 09/07/2012  . Gout 09/07/2012  . Inguinal hernia 09/07/2012  . History of hemorrhoidectomy 05/20/2011  . Neuroma of foot     Past Surgical History:  Procedure Laterality Date  . APPENDECTOMY  1968  . arthroscopic knee surgery     x 3 each knee  . CARDIAC CATHETERIZATION  2007   indigestion results normal per pt  .  cervical neck surgery  2018   plate and screws dr Rita Ohara  . CHOLECYSTECTOMY  03/12/2004   laparospic  . COLONOSCOPY  04/07/2020   polyp removed had total of 9 colonscopies  . FOOT BONE EXCISION     bone spurs on left foot  . FOOT SURGERY Bilateral    for bone spurs  . [place]  . KNEE ARTHROPLASTY  2005 and 2009   bilateral  . pre skin cancer areas removed from face  03/2019   10-07-2019 spots removed from face and ears  . TONSILLECTOMY  1970  . UPPER GI ENDOSCOPY  04/07/2020       Family History  Problem Relation Age of Onset  . Diabetes Mother   . Heart disease Mother   . Other Mother        dementia  . Cancer Father        colon  . Heart disease Father        heart attack  . Diabetes Father   . Diabetes Sister   . Hypertension Sister   . Hypertension Sister   . Diabetes Sister   . COPD Brother   . Heart disease Brother   . Other Brother        dementia    Social History  Tobacco Use  . Smoking status: Never Smoker  . Smokeless tobacco: Never Used  Vaping Use  . Vaping Use: Never used  Substance Use Topics  . Alcohol use: Yes    Comment: occ beer  . Drug use: Never    Home Medications Prior to Admission medications   Medication Sig Start Date End Date Taking? Authorizing Provider  acetaminophen (TYLENOL) 650 MG CR tablet Take 650 mg by mouth every 8 (eight) hours as needed for pain.    [provider]  allopurinol (ZYLOPRIM) 300 MG tablet Take 300 mg by mouth daily.    [provider]  amoxicillin (AMOXIL) 500 MG capsule Take 2,000 mg by mouth See admin instructions. Take 1 hour before dental visits    [provider]  B Complex-C (SUPER B COMPLEX PO) Take by mouth daily.    [provider]  betamethasone dipropionate (DIPROLENE) 0.05 % cream Apply 1 application topically 2 (two) times daily as needed (rash).    [provider]  Chlorpheniramine-Phenylephrine (SINUS & ALLERGY PO) Take 10 mg  by mouth daily. Brand name--- Alloclear    [provider]  cholecalciferol (VITAMIN D) 1000 units tablet Take 2,000 Units by mouth daily.    [provider]  colchicine 0.6 MG tablet Take 0.6 mg by mouth daily as needed (gout pain).    [provider]  dextromethorphan-guaiFENesin (ROBITUSSIN-DM) 10-100 MG/5ML liquid Take by mouth every 4 (four) hours as needed for cough. TWO TEASPOONS    [provider]  diazepam (VALIUM) 5 MG tablet Take 1 tablet (5 mg total) by mouth every 6 (six) hours as needed for muscle spasms (difficulty urinating). 06/25/20   Michael Boston, MD  diclofenac sodium (VOLTAREN) 1 % GEL Apply 2 g topically 4 (four) times daily as needed (pain).    [provider]  fluconazole (DIFLUCAN) 150 MG tablet Take 150 mg by mouth See admin instructions. Take 1 tablet daily for 4 days for fungus on back and chest    [provider]  fluticasone (FLONASE) 50 MCG/ACT nasal spray Place 1-2 sprays into both nostrils daily as needed for allergies or rhinitis.    [provider]  gabapentin (NEURONTIN) 300 MG capsule Take 300 mg by mouth 2 (two) times daily. Takes one capsule in am and two capsules in pm    [provider]  ketoconazole (NIZORAL) 2 % cream Apply 1 application topically daily as needed for irritation.    [provider]  losartan (COZAAR) 100 MG tablet Take 100 mg by mouth daily.    [provider]  metoprolol tartrate (LOPRESSOR) 50 MG tablet Take 25 mg by mouth 2 (two) times daily.    [provider]  Multiple Vitamins-Minerals (PRESERVISION AREDS) CAPS Take 1 capsule by mouth 2 (two) times daily.    [provider]  Naproxen Sod-diphenhydrAMINE 220-25 MG TABS Take 2 tablets by mouth at bedtime as needed (pain/sleep).    [provider]  omeprazole (PRILOSEC) 20 MG capsule Take 20 mg by mouth at bedtime. And as needed    [provider]  oxyCODONE (OXY  IR/ROXICODONE) 5 MG immediate release tablet Take 1-2 tablets (5-10 mg total) by mouth every 6 (six) hours as needed for moderate pain, severe pain or breakthrough pain. 06/25/20   Michael Boston, MD  simvastatin (ZOCOR) 40 MG tablet Take 40 mg by mouth at bedtime.    [provider]  tiZANidine (ZANAFLEX) 4 MG tablet Take 4 mg by mouth  every 8 (eight) hours as needed for muscle spasms.    [provider]  Triprolidine-Pseudoephedrine (ANTIHISTAMINE PO) Take 1-2 tablets by mouth every 4 (four) hours as needed. [place]    [provider]    Allergies    Codeine, Dilaudid [hydromorphone hcl], Demerol, and Other  Review of Systems   Review of Systems 10 systems reviewed and negative except as per HPI Physical Exam Updated Vital Signs BP (!) 151/90   Pulse 65   Temp 99 F (37.2 C)   Resp 18   Ht 6\' 1"  (1.854 m)   Wt 92.1 kg   SpO2 99%   BMI 26.78 kg/m   Physical Exam Constitutional:      Comments: Alert nontoxic clinically well in appearance  Eyes:     Extraocular Movements: Extraocular movements intact.  Cardiovascular:     Rate and Rhythm: Normal rate and regular rhythm.  Pulmonary:     Effort: Pulmonary effort is normal.     Breath sounds: Normal breath sounds.  Abdominal:     General: There is no distension.     Palpations: Abdomen is soft.     Tenderness: There is no abdominal tenderness. There is no guarding.     Comments: Exam done after placement of Foley catheter.  Genitourinary:    Comments: Penis normal in appearance with a Foley catheter now in place.  No genital swelling or erythema.  Anal area visually examined.  No digital exam done.  Ecchymoses around the perianal region.  No visible clot or active drainage.  No large or extensive swelling of the anus. Musculoskeletal:        General: Normal range of motion.  Skin:    General: Skin is warm and dry.  Neurological:     General: No focal deficit present.     Mental  Status: He is oriented to person, place, and time.     Coordination: Coordination normal.  Psychiatric:        Mood and Affect: Mood normal.     ED Results / Procedures / Treatments   Labs (all labs ordered are listed, but only abnormal results are displayed) Labs Reviewed  URINE CULTURE  URINALYSIS, ROUTINE W REFLEX MICROSCOPIC    EKG None  Radiology No results found.  Procedures Procedures   Medications Ordered in ED Medications - No data to display  ED Course  I have reviewed the triage vital signs and the nursing notes.  Pertinent labs & imaging results that were available during my care of the patient were reviewed by me and considered in my medical decision making (see chart for details).    MDM Rules/Calculators/A&P                          Patient presents with postoperative urinary retention.  Bladder scan showed greater than 1000 mL.  Foley catheter placed with output of clear urine.  Patient feels much improved.  No apparent immediate postoperative complications.  Patient does not have complaints relating to his anal surgery.  Patient had history of postoperative urinary retention in his 7s.  Will leave catheter in place and the patient's PCP has discussed follow-up plans and management with the patient.  Return precautions reviewed. Final Clinical Impression(s) / ED Diagnoses Final diagnoses:  Urinary retention    Rx / DC Orders ED Discharge Orders    None       Charlesetta Shanks, MD 06/26/20 1145

## 2020-06-28 LAB — URINE CULTURE: Culture: NO GROWTH

## 2020-06-29 ENCOUNTER — Encounter (HOSPITAL_BASED_OUTPATIENT_CLINIC_OR_DEPARTMENT_OTHER): Payer: Self-pay | Admitting: Surgery

## 2020-07-02 ENCOUNTER — Encounter (HOSPITAL_COMMUNITY): Payer: Self-pay

## 2020-07-02 ENCOUNTER — Emergency Department (HOSPITAL_COMMUNITY)
Admission: EM | Admit: 2020-07-02 | Discharge: 2020-07-02 | Disposition: A | Discharge status: Home / Self Care | Payer: Medicare Other | Attending: Emergency Medicine | Admitting: Emergency Medicine

## 2020-07-02 ENCOUNTER — Telehealth (HOSPITAL_COMMUNITY): Payer: Self-pay | Admitting: Emergency Medicine

## 2020-07-02 ENCOUNTER — Emergency Department (HOSPITAL_COMMUNITY): Payer: Medicare Other

## 2020-07-02 ENCOUNTER — Other Ambulatory Visit: Payer: Self-pay

## 2020-07-02 DIAGNOSIS — Z79899 Other long term (current) drug therapy: Secondary | ICD-10-CM | POA: Diagnosis not present

## 2020-07-02 DIAGNOSIS — K5790 Diverticulosis of intestine, part unspecified, without perforation or abscess without bleeding: Secondary | ICD-10-CM | POA: Diagnosis not present

## 2020-07-02 DIAGNOSIS — Z96653 Presence of artificial knee joint, bilateral: Secondary | ICD-10-CM | POA: Insufficient documentation

## 2020-07-02 DIAGNOSIS — I10 Essential (primary) hypertension: Secondary | ICD-10-CM | POA: Diagnosis not present

## 2020-07-02 DIAGNOSIS — R339 Retention of urine, unspecified: Secondary | ICD-10-CM

## 2020-07-02 DIAGNOSIS — R338 Other retention of urine: Secondary | ICD-10-CM | POA: Diagnosis not present

## 2020-07-02 DIAGNOSIS — K5730 Diverticulosis of large intestine without perforation or abscess without bleeding: Secondary | ICD-10-CM | POA: Insufficient documentation

## 2020-07-02 DIAGNOSIS — A0472 Enterocolitis due to Clostridium difficile, not specified as recurrent: Secondary | ICD-10-CM

## 2020-07-02 DIAGNOSIS — R197 Diarrhea, unspecified: Secondary | ICD-10-CM

## 2020-07-02 LAB — GASTROINTESTINAL PANEL BY PCR, STOOL (REPLACES STOOL CULTURE)

## 2020-07-02 LAB — URINALYSIS, ROUTINE W REFLEX MICROSCOPIC
Bilirubin Urine: NEGATIVE
Glucose, UA: NEGATIVE mg/dL
Hgb urine dipstick: NEGATIVE
Ketones, ur: NEGATIVE mg/dL
Leukocytes,Ua: NEGATIVE
Nitrite: NEGATIVE
Protein, ur: NEGATIVE mg/dL
Specific Gravity, Urine: 1.015 (ref 1.005–1.030)
pH: 5 (ref 5.0–8.0)

## 2020-07-02 LAB — COMPREHENSIVE METABOLIC PANEL
ALT: 44 U/L (ref 0–44)
AST: 34 U/L (ref 15–41)
Albumin: 4.2 g/dL (ref 3.5–5.0)
Alkaline Phosphatase: 75 U/L (ref 38–126)
Anion gap: 10 (ref 5–15)
BUN: 23 mg/dL (ref 8–23)
CO2: 19 mmol/L — ABNORMAL LOW (ref 22–32)
Calcium: 9.3 mg/dL (ref 8.9–10.3)
Chloride: 107 mmol/L (ref 98–111)
Creatinine, Ser: 0.92 mg/dL (ref 0.61–1.24)
GFR, Estimated: >60 mL/min (ref >60)
Glucose, Bld: 129 mg/dL — ABNORMAL HIGH (ref 70–99)
Potassium: 3.9 mmol/L (ref 3.5–5.1)
Sodium: 136 mmol/L (ref 135–145)
Total Bilirubin: 0.5 mg/dL (ref 0.3–1.2)
Total Protein: 7.1 g/dL (ref 6.5–8.1)

## 2020-07-02 LAB — CBC
HCT: 37.5 % — ABNORMAL LOW (ref 39.0–52.0)
Hemoglobin: 12.8 g/dL — ABNORMAL LOW (ref 13.0–17.0)
MCH: 31.7 pg (ref 26.0–34.0)
MCHC: 34.1 g/dL (ref 30.0–36.0)
MCV: 92.8 fL (ref 80.0–100.0)
Platelets: 211 10*3/uL (ref 150–400)
RBC: 4.04 MIL/uL — ABNORMAL LOW (ref 4.22–5.81)
RDW: 13.5 % (ref 11.5–15.5)
WBC: 15.5 10*3/uL — ABNORMAL HIGH (ref 4.0–10.5)
nRBC: 0 % (ref 0.0–0.2)

## 2020-07-02 LAB — C DIFFICILE QUICK SCREEN W PCR REFLEX
C Diff antigen: POSITIVE — AB
C Diff interpretation: DETECTED
C Diff toxin: POSITIVE — AB

## 2020-07-02 MED ORDER — FIDAXOMICIN 200 MG PO TABS
200.0000 mg | ORAL_TABLET | Freq: Two times a day (BID) | ORAL | 0 refills | Status: DC
Start: 2020-07-02 — End: 2024-01-06

## 2020-07-02 MED ORDER — LOPERAMIDE HCL 2 MG PO CAPS
4.0000 mg | ORAL_CAPSULE | Freq: Once | ORAL | Status: AC
  Administered 2020-07-02: 4 mg via ORAL
  Filled 2020-07-02: qty 2

## 2020-07-02 MED ORDER — LOPERAMIDE HCL 2 MG PO CAPS: 2.0000 mg | ORAL_CAPSULE | Freq: Four times a day (QID) | ORAL | 0 refills | Status: AC | PRN

## 2020-07-02 MED ORDER — VANCOMYCIN HCL 125 MG PO CAPS: 125.0000 mg | ORAL_CAPSULE | Freq: Four times a day (QID) | ORAL | 0 refills | Status: AC

## 2020-07-02 MED ORDER — LACTATED RINGERS IV BOLUS
1000.0000 mL | Freq: Once | INTRAVENOUS | Status: AC
  Administered 2020-07-02: 1000 mL via INTRAVENOUS

## 2020-07-02 MED ORDER — IOHEXOL 300 MG/ML  SOLN
100.0000 mL | Freq: Once | INTRAMUSCULAR | Status: AC | PRN
  Administered 2020-07-02: 100 mL via INTRAVENOUS

## 2020-07-02 NOTE — Telephone Encounter (Signed)
Pharmacy called stating they could not give the patient the original medication prescribed, this was changed to vancomycin

## 2020-07-02 NOTE — ED Triage Notes (Signed)
Pt had rectal surgery last Thursday,  yesterday he started to have diarrhea and states that he's had diarrhea 28 times in one day He was unable to urinate the next day after surgery and received a catheter, they tried to take it out Monday and he still couldn't urinate

## 2020-07-02 NOTE — ED Provider Notes (Signed)
[place] [facility] DEPT Provider Note   CSN: [id number] Arrival date & time: 07/02/20  4010     History No chief complaint on file.   [name] is a 70 y.o. male.  Week ago patient had hemorrhoidectomy and also some other type of mass near his rectum removed.  He been doing okay with off and on again urinary retention requiring Foley catheter but already placed he has urology follow-up.  He states that over the last 24 hours he had approximately 28 loose stools.  No blood.  Has some irritation because of that.  No other associated symptoms.  Has not tried thing for the symptoms.  He did stop his MiraLAX couple days ago.        Past Medical History:  Diagnosis Date  . GERD (gastroesophageal reflux disease)   . Gout 2009  . Hearing loss   . Hemorrhoids   . History of colonic polyps   . Hyperlipidemia   . Hypertension   . Left shoulder pain   . Low back pain   . Neuroma of foot    bilateral  . Neuropathy    feet and hands  . Osteoarthritis    shoulder neck all over  . Pre-diabetes   . Sinus problem   . Tinea versicolor   . Wears contact lenses     Patient Active Problem List   Diagnosis Date Noted  . 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 Prolapsed internal hemorrhoids, grade 3, s/p ligation/pexy 06/25/2020 06/25/2020  . Arthritis 09/07/2012  . Gout 09/07/2012  . Inguinal hernia 09/07/2012  . History of hemorrhoidectomy 05/20/2011  . Neuroma of foot     Past Surgical History:  Procedure Laterality Date  . APPENDECTOMY  1968  . arthroscopic knee surgery     x 3 each knee  . CARDIAC CATHETERIZATION  2007   indigestion results normal per pt  . cervical neck surgery  2018   plate and screws dr Rita Ohara  . CHOLECYSTECTOMY  03/12/2004   laparospic  . COLONOSCOPY  04/07/2020   polyp removed had total of 9 colonscopies  . EVALUATION UNDER ANESTHESIA WITH HEMORRHOIDECTOMY N/A 06/25/2020   Procedure: EXCISION  OF DISTAL RECTAL MASS,  HEMORRHOIDAL LIGATION AND PEXY,  HEMORRHOIDECTOMY. ANORECTAL EXAMINATION UNDER ANESTHESIA;  Surgeon: Michael Boston, MD;  Location: [facility];  Service: General;  Laterality: N/A;  GEN AND LOCAL  . FOOT BONE EXCISION     bone spurs on left foot  . FOOT SURGERY Bilateral    for bone spurs  . [place]  . KNEE ARTHROPLASTY  2005 and 2009   bilateral  . pre skin cancer areas removed from face  03/2019   10-07-2019 spots removed from face and ears  . TONSILLECTOMY  1970  . UPPER GI ENDOSCOPY  04/07/2020       Family History  Problem Relation Age of Onset  . Diabetes Mother   . Heart disease Mother   . Other Mother        dementia  . Cancer Father        colon  . Heart disease Father        heart attack  . Diabetes Father   . Diabetes Sister   . Hypertension Sister   . Hypertension Sister   . Diabetes Sister   . COPD Brother   . Heart disease Brother   . Other Brother  dementia    Social History   Tobacco Use  . Smoking status: Never Smoker  . Smokeless tobacco: Never Used  Vaping Use  . Vaping Use: Never used  Substance Use Topics  . Alcohol use: Yes    Comment: occ beer  . Drug use: Never    Home Medications Prior to Admission medications   Medication Sig Start Date End Date Taking? Authorizing Provider  acetaminophen (TYLENOL) 650 MG CR tablet Take 650 mg by mouth every 8 (eight) hours as needed for pain.   Yes [provider]  allopurinol (ZYLOPRIM) 300 MG tablet Take 300 mg by mouth daily.   Yes [provider]  B Complex-C (SUPER B COMPLEX PO) Take by mouth daily.   Yes [provider]  betamethasone dipropionate (DIPROLENE) 0.05 % cream Apply 1 application topically 2 (two) times daily as needed (rash).   Yes [provider]  Chlorpheniramine-Phenylephrine (SINUS & ALLERGY PO) Take 10 mg by mouth daily. Brand name--- Alloclear   Yes [provider]   cholecalciferol (VITAMIN D) 1000 units tablet Take 2,000 Units by mouth daily.   Yes [provider]  colchicine 0.6 MG tablet Take 0.6 mg by mouth daily as needed (gout pain).   Yes [provider]  dextromethorphan-guaiFENesin (ROBITUSSIN-DM) 10-100 MG/5ML liquid Take by mouth every 4 (four) hours as needed for cough. TWO TEASPOONS   Yes [provider]  diclofenac sodium (VOLTAREN) 1 % GEL Apply 2 g topically 4 (four) times daily as needed (pain).   Yes [provider]  fluticasone (FLONASE) 50 MCG/ACT nasal spray Place 1-2 sprays into both nostrils daily as needed for allergies or rhinitis.   Yes [provider]  gabapentin (NEURONTIN) 300 MG capsule Take 300 mg by mouth 2 (two) times daily. Takes one capsule in am and two capsules in pm   Yes [provider]  ketoconazole (NIZORAL) 2 % cream Apply 1 application topically daily as needed for irritation.   Yes [provider]  losartan (COZAAR) 100 MG tablet Take 100 mg by mouth daily.   Yes [provider]  metoprolol tartrate (LOPRESSOR) 50 MG tablet Take 25 mg by mouth 2 (two) times daily.   Yes [provider]  Multiple Vitamins-Minerals (PRESERVISION AREDS) CAPS Take 1 capsule by mouth 2 (two) times daily.   Yes [provider]  Naproxen Sod-diphenhydrAMINE 220-25 MG TABS Take 2 tablets by mouth at bedtime as needed (pain/sleep).   Yes [provider]  omeprazole (PRILOSEC) 20 MG capsule Take 20 mg by mouth at bedtime. And as needed   Yes [provider]  oxyCODONE (OXY IR/ROXICODONE) 5 MG immediate release tablet Take 1-2 tablets (5-10 mg total) by mouth every 6 (six) hours as needed for moderate pain, severe pain or breakthrough pain. 06/25/20  Yes Michael Boston, MD  simvastatin (ZOCOR) 40 MG tablet Take 40 mg by mouth at bedtime.   Yes [provider]  tiZANidine (ZANAFLEX) 4 MG tablet Take 4 mg by mouth every 8 (eight) hours  as needed for muscle spasms.   Yes [provider]  Triprolidine-Pseudoephedrine (ANTIHISTAMINE PO) Take 1-2 tablets by mouth every 4 (four) hours as needed. [place]   Yes [provider]  diazepam (VALIUM) 5 MG tablet Take 1 tablet (5 mg total) by mouth every 6 (six) hours as needed for muscle spasms (difficulty urinating). 06/25/20   Michael Boston, MD    Allergies    Codeine, Dilaudid [hydromorphone hcl], Demerol,  Other, and Zoster vac recomb adjuvanted  Review of Systems   Review of Systems  All other systems reviewed and are negative.   Physical Exam Updated Vital Signs BP (!) 162/92   Pulse 78   Temp 99 F (37.2 C) (Oral)   Resp 20   SpO2 96%   Physical Exam Vitals and nursing note reviewed.  Constitutional:      Appearance: He is well-developed.  HENT:     Head: Normocephalic and atraumatic.     Mouth/Throat:     Mouth: Mucous membranes are moist.     Pharynx: Oropharynx is clear.  Eyes:     Pupils: Pupils are equal, round, and reactive to light.  Cardiovascular:     Rate and Rhythm: Normal rate.  Pulmonary:     Effort: Pulmonary effort is normal. No respiratory distress.  Abdominal:     General: Abdomen is flat. There is no distension.  Musculoskeletal:        General: Normal range of motion.     Cervical back: Normal range of motion.  Skin:    General: Skin is warm.     Coloration: Skin is not jaundiced or pale.  Neurological:     General: No focal deficit present.     Mental Status: He is alert.     ED Results / Procedures / Treatments   Labs (all labs ordered are listed, but only abnormal results are displayed) Labs Reviewed  CBC - Abnormal; Notable for the following components:      Result Value   WBC 15.5 (*)    RBC 4.04 (*)    Hemoglobin 12.8 (*)    HCT 37.5 (*)    All other components within normal limits  COMPREHENSIVE METABOLIC PANEL - Abnormal; Notable for the following components:   CO2 19 (*)    Glucose,  Bld 129 (*)    All other components within normal limits  GASTROINTESTINAL PANEL BY PCR, STOOL (REPLACES STOOL CULTURE)  C DIFFICILE QUICK SCREEN W PCR REFLEX  URINALYSIS, ROUTINE W REFLEX MICROSCOPIC    EKG None  Radiology CT ABDOMEN PELVIS W CONTRAST  Result Date: 07/02/2020 CLINICAL DATA:  Recent rectal surgery (rectal polypectomy and hemorrhoid banding). Interval diarrhea. EXAM: CT ABDOMEN AND PELVIS WITH CONTRAST TECHNIQUE: Multidetector CT imaging of the abdomen and pelvis was performed using the standard protocol following bolus administration of intravenous contrast. CONTRAST:  174mL OMNIPAQUE IOHEXOL 300 MG/ML  SOLN COMPARISON:  09/11/2012 FINDINGS: Lower chest:  No acute finding.  Coronary atherosclerosis. Hepatobiliary: No worrisome finding in the liver.Cholecystectomy. No bile duct dilatation. Pancreas: Unremarkable. Spleen: Unremarkable. Adrenals/Urinary Tract: Negative adrenals. No hydronephrosis or stone. Foley catheter with completely decompressed bladder. Stomach/Bowel: Pocket of gas anterior to the rectum and posterior to the prostate measuring up to 16 x 9 mm. No adjacent inflammation is seen. History of diarrhea but no detected liquid stool. Scattered colonic diverticula. No appendicitis. Vascular/Lymphatic: Atheromatous calcification. No mass or adenopathy. Reproductive:Prosthetic calcifications. Other: No ascites or pneumoperitoneum. Musculoskeletal: Spondylosis. IMPRESSION: 1. Small gas collection between the low rectum and prostate, presumably postprocedural. No fluid collection or pelvic fat inflammation. 2. Foley catheter with decompressed bladder. 3. Colonic diverticulosis. Electronically Signed   By: Monte Fantasia M.D.   On: 07/02/2020 06:20    Procedures Procedures   Medications Ordered in ED Medications  loperamide (IMODIUM) capsule 4 mg (has no administration in time range)  lactated ringers bolus 1,000 mL (1,000 mLs Intravenous Bolus from Bag 07/02/20 0250)   iohexol (OMNIPAQUE)  300 MG/ML solution 100 mL (100 mLs Intravenous Contrast Given 07/02/20 0537)    ED Course  I have reviewed the triage vital signs and the nursing notes.  Pertinent labs & imaging results that were available during my care of the patient were reviewed by me and considered in my medical decision making (see chart for details).    MDM Rules/Calculators/A&P                          Overall patient appears well.  Urine is clear.  CT scan is unremarkable.  Is not significantly dehydrated.  I did discuss his situation with Dr. Thermon Leyland who thought that Imodium would be appropriate along with fiber and probiotics.  He did recommend waiting for C. difficile prior to discharge as he would have had a treatment on antibiotics.  I discussed with patient not taking imodium if c diff positive. Otherwise supportive care of probiotics and fiber either way. Care transferred to Dr. Zenia Resides pending c diff results.   Final Clinical Impression(s) / ED Diagnoses Final diagnoses:  None    Rx / DC Orders ED Discharge Orders    None       Oktober Glazer, Corene Cornea, MD 07/03/20 [phone]

## 2020-07-02 NOTE — ED Notes (Signed)
Pt states that when they called urology for his follow up they told him it would be 6-8 weeks before they could see him.

## 2020-07-02 NOTE — ED Notes (Signed)
Brought pt dc papers, advised pt to take abx that were sent to pharmacy, verified w provider ab loperamide and provider advised pt to not take loperamide since pt is c diff +. Put new brief on pt, empited foley cath bag, brought pt paper scrub pants and a wheelchair to bring pt to wife's car. Pt and wife did not have any questions, ready for discharge

## 2020-10-02 ENCOUNTER — Emergency Department (HOSPITAL_BASED_OUTPATIENT_CLINIC_OR_DEPARTMENT_OTHER): Payer: Medicare Other

## 2020-10-02 ENCOUNTER — Other Ambulatory Visit: Payer: Self-pay

## 2020-10-02 ENCOUNTER — Ambulatory Visit
Admission: EM | Admit: 2020-10-02 | Discharge: 2020-10-02 | Disposition: A | Discharge status: Home / Self Care | Payer: Medicare Other

## 2020-10-02 ENCOUNTER — Encounter: Payer: Self-pay | Admitting: Emergency Medicine

## 2020-10-02 ENCOUNTER — Emergency Department (HOSPITAL_BASED_OUTPATIENT_CLINIC_OR_DEPARTMENT_OTHER)
Admission: EM | Admit: 2020-10-02 | Discharge: 2020-10-02 | Disposition: A | Discharge status: Home / Self Care | Payer: Medicare Other | Attending: Emergency Medicine | Admitting: Emergency Medicine

## 2020-10-02 ENCOUNTER — Encounter (HOSPITAL_BASED_OUTPATIENT_CLINIC_OR_DEPARTMENT_OTHER): Payer: Self-pay | Admitting: Emergency Medicine

## 2020-10-02 DIAGNOSIS — Z96659 Presence of unspecified artificial knee joint: Secondary | ICD-10-CM | POA: Diagnosis not present

## 2020-10-02 DIAGNOSIS — I160 Hypertensive urgency: Secondary | ICD-10-CM

## 2020-10-02 DIAGNOSIS — I10 Essential (primary) hypertension: Secondary | ICD-10-CM

## 2020-10-02 DIAGNOSIS — R519 Headache, unspecified: Secondary | ICD-10-CM

## 2020-10-02 DIAGNOSIS — R079 Chest pain, unspecified: Secondary | ICD-10-CM

## 2020-10-02 DIAGNOSIS — Z79899 Other long term (current) drug therapy: Secondary | ICD-10-CM | POA: Diagnosis not present

## 2020-10-02 LAB — CBC
HCT: 42 % (ref 39.0–52.0)
Hemoglobin: 14.2 g/dL (ref 13.0–17.0)
MCH: 30.8 pg (ref 26.0–34.0)
MCHC: 33.8 g/dL (ref 30.0–36.0)
MCV: 91.1 fL (ref 80.0–100.0)
Platelets: 189 10*3/uL (ref 150–400)
RBC: 4.61 MIL/uL (ref 4.22–5.81)
RDW: 13.6 % (ref 11.5–15.5)
WBC: 7.3 10*3/uL (ref 4.0–10.5)
nRBC: 0 % (ref 0.0–0.2)

## 2020-10-02 LAB — BASIC METABOLIC PANEL
Anion gap: 10 (ref 5–15)
BUN: 21 mg/dL (ref 8–23)
CO2: 22 mmol/L (ref 22–32)
Calcium: 9.1 mg/dL (ref 8.9–10.3)
Chloride: 104 mmol/L (ref 98–111)
Creatinine, Ser: 0.88 mg/dL (ref 0.61–1.24)
GFR, Estimated: >60 mL/min (ref >60)
Glucose, Bld: 88 mg/dL (ref 70–99)
Potassium: 3.8 mmol/L (ref 3.5–5.1)
Sodium: 136 mmol/L (ref 135–145)

## 2020-10-02 LAB — TROPONIN I (HIGH SENSITIVITY)
Troponin I (High Sensitivity): 4 ng/L (ref <18)
Troponin I (High Sensitivity): 5 ng/L (ref <18)

## 2020-10-02 MED ORDER — IOHEXOL 350 MG/ML SOLN
100.0000 mL | Freq: Once | INTRAVENOUS | Status: AC | PRN
  Administered 2020-10-02: 100 mL via INTRAVENOUS

## 2020-10-02 MED ORDER — METOCLOPRAMIDE HCL 5 MG/ML IJ SOLN
10.0000 mg | Freq: Once | INTRAMUSCULAR | Status: AC
  Administered 2020-10-02: 10 mg via INTRAVENOUS
  Filled 2020-10-02: qty 2

## 2020-10-02 MED ORDER — DIPHENHYDRAMINE HCL 50 MG/ML IJ SOLN
25.0000 mg | Freq: Once | INTRAMUSCULAR | Status: AC
  Administered 2020-10-02: 25 mg via INTRAVENOUS
  Filled 2020-10-02: qty 1

## 2020-10-02 NOTE — ED Triage Notes (Signed)
PT here with hx of HTN and BP increasing since yesterday. Pt endorses headache. Reports 170/80 at home.

## 2020-10-02 NOTE — Discharge Instructions (Addendum)
-  Head straight to the emergency department for further evaluation and management of your blood pressure.  Significantly elevated blood pressure associated with headaches and dizziness is a red flag symptom for a stroke.  This needs to be monitored in the emergency department setting.  Please make sure that your wife drives you, and if symptoms get worse on the way like dizziness, headaches, new chest pain, shortness of breath, new weakness-stop and call 911 immediately.

## 2020-10-02 NOTE — ED Triage Notes (Signed)
Seen at [facility] today for HTN since yesterday and headache.  Also endorses having chest pressure that he thought was indigestion.

## 2020-10-02 NOTE — Discharge Instructions (Addendum)
Your CT scan today did not show any bleeding in your brain or any obvious arterial dissection  Please avoid eating too much salt  Continue blood pressure medicines as prescribed  Please keep your blood pressure log and talk to your doctor on Monday  Return to ER if you have worse headache, chest pain, blood pressure over 200

## 2020-10-02 NOTE — ED Provider Notes (Signed)
[facility] EMERGENCY DEPARTMENT Provider Note   CSN: [id number] Arrival date & time: 10/02/20  1730     History Chief Complaint  Patient presents with   Hypertension   Chest Pain    [name] is a 70 y.o. male history of hypertension, hyperlipidemia, here presenting with hypertension and headache and chest pain.  Patient states that he had some headache today.  He states that is diffuse and he checked his blood pressure and is around the 160s.  He went to urgent care and blood pressure was noted to be in the 170s.  He is compliant with his losartan.  Patient was sent over to the ED and on the way here, he had some chest pressure.  He states that his chest pressure went away.  He states that he had a cath previously and had normal coronaries.  The history is provided by the patient.      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 06/25/2020 06/25/2020   Prolapsed internal hemorrhoids, grade 3, s/p ligation/pexy 06/25/2020 06/25/2020   Arthritis 09/07/2012   Gout 09/07/2012   Inguinal hernia 09/07/2012   History of hemorrhoidectomy 05/20/2011   Neuroma of foot     Past Surgical History:  Procedure Laterality Date   APPENDECTOMY  1968   arthroscopic knee surgery     x 3 each knee   CARDIAC CATHETERIZATION  2007   indigestion results normal per pt   cervical neck surgery  2018   plate and screws dr Rita Ohara   CHOLECYSTECTOMY  03/12/2004   laparospic   COLONOSCOPY  04/07/2020   polyp  removed had total of 9 colonscopies   EVALUATION UNDER ANESTHESIA WITH HEMORRHOIDECTOMY N/A 06/25/2020   Procedure: EXCISION OF DISTAL RECTAL MASS,  HEMORRHOIDAL LIGATION AND PEXY,  HEMORRHOIDECTOMY. ANORECTAL EXAMINATION UNDER ANESTHESIA;  Surgeon: Michael Boston, MD;  Location: [facility];  Service: General;  Laterality: N/A;  GEN AND LOCAL   FOOT BONE EXCISION     bone spurs on left foot   FOOT SURGERY Bilateral    for bone spurs   [place]  2005 and 2009   bilateral   pre skin cancer areas removed from face  03/2019   10-07-2019 spots removed from face and ears   TONSILLECTOMY  1970   UPPER GI ENDOSCOPY  04/07/2020       Family History  Problem Relation Age of Onset   Diabetes Mother    Heart disease Mother    Other Mother        dementia   Cancer Father        colon   Heart disease Father        heart attack   Diabetes Father    Diabetes Sister    Hypertension Sister  Hypertension Sister    Diabetes Sister    COPD Brother    Heart disease Brother    Other Brother        dementia    Social History   Tobacco Use   Smoking status: Never   Smokeless tobacco: Never  Vaping Use   Vaping Use: Never used  Substance Use Topics   Alcohol use: Yes    Comment: occ beer   Drug use: Never    Home Medications Prior to Admission medications   Medication Sig Start Date End Date Taking? Authorizing Provider  acetaminophen (TYLENOL) 650 MG CR tablet Take 650 mg by mouth every 8 (eight) hours as needed for pain.    [provider]  allopurinol (ZYLOPRIM) 300 MG tablet Take 300 mg by mouth daily.    [provider]  B Complex-C (SUPER B COMPLEX PO) Take by mouth daily.    [provider]  betamethasone dipropionate (DIPROLENE) 0.05 % cream Apply 1 application topically 2 (two) times daily as needed (rash).    [provider]  Chlorpheniramine-Phenylephrine (SINUS & ALLERGY PO) Take 10 mg  by mouth daily. Brand name--- Alloclear    [provider]  cholecalciferol (VITAMIN D) 1000 units tablet Take 2,000 Units by mouth daily.    [provider]  colchicine 0.6 MG tablet Take 0.6 mg by mouth daily as needed (gout pain).    [provider]  dextromethorphan-guaiFENesin (ROBITUSSIN-DM) 10-100 MG/5ML liquid Take by mouth every 4 (four) hours as needed for cough. TWO TEASPOONS    [provider]  diazepam (VALIUM) 5 MG tablet Take 1 tablet (5 mg total) by mouth every 6 (six) hours as needed for muscle spasms (difficulty urinating). 06/25/20   Michael Boston, MD  diclofenac sodium (VOLTAREN) 1 % GEL Apply 2 g topically 4 (four) times daily as needed (pain).    [provider]  fidaxomicin (DIFICID) 200 MG TABS tablet Take 1 tablet (200 mg total) by mouth 2 (two) times daily. 07/02/20   Lacretia Leigh, MD  fluticasone [facility]) 50 MCG/ACT nasal spray Place 1-2 sprays into both nostrils daily as needed for allergies or rhinitis.    [provider]  gabapentin (NEURONTIN) 300 MG capsule Take 300 mg by mouth 2 (two) times daily. Takes one capsule in am and two capsules in pm    [provider]  ketoconazole (NIZORAL) 2 % cream Apply 1 application topically daily as needed for irritation.    [provider]  loperamide (IMODIUM) 2 MG capsule Take 1 capsule (2 mg total) by mouth 4 (four) times daily as needed for diarrhea or loose stools. 07/02/20   Mesner, Corene Cornea, MD  losartan (COZAAR) 100 MG tablet Take 100 mg by mouth daily.    [provider]  metoprolol tartrate (LOPRESSOR) 50 MG tablet Take 25 mg by mouth 2 (two) times daily.    [provider]  Multiple Vitamins-Minerals (PRESERVISION AREDS) CAPS Take 1 capsule by mouth 2 (two) times daily.    [provider]  Naproxen Sod-diphenhydrAMINE 220-25 MG TABS Take 2 tablets by mouth at bedtime as needed (pain/sleep).    [provider]   omeprazole (PRILOSEC) 20 MG capsule Take 20 mg by mouth at bedtime. And as needed    [provider]  oxyCODONE (OXY IR/ROXICODONE) 5 MG immediate release tablet Take 1-2 tablets (5-10 mg total) by mouth every 6 (six) hours as needed for moderate pain, severe pain or breakthrough pain. 06/25/20  Michael Boston, MD  simvastatin (ZOCOR) 40 MG tablet Take 40 mg by mouth at bedtime.    [provider]  tiZANidine (ZANAFLEX) 4 MG tablet Take 4 mg by mouth every 8 (eight) hours as needed for muscle spasms.    [provider]  Triprolidine-Pseudoephedrine (ANTIHISTAMINE PO) Take 1-2 tablets by mouth every 4 (four) hours as needed. [place]    [provider]    Allergies    Codeine, Dilaudid [hydromorphone hcl], Demerol, Other, and Zoster vac recomb adjuvanted  Review of Systems   Review of Systems  Cardiovascular:  Positive for chest pain.  All other systems reviewed and are negative.  Physical Exam Updated Vital Signs BP (!) 146/84   Pulse 60   Temp 98.4 F (36.9 C) (Oral)   Resp 14   Ht '6\' 2"'$  (1.88 m)   Wt 91.6 kg   SpO2 94%   BMI 25.94 kg/m   Physical Exam Vitals and nursing note reviewed.  Constitutional:      Comments: Slightly uncomfortable   HENT:     Head: Normocephalic and atraumatic.  Eyes:     Extraocular Movements: Extraocular movements intact.     Pupils: Pupils are equal, round, and reactive to light.  Cardiovascular:     Rate and Rhythm: Normal rate and regular rhythm.     Heart sounds: Normal heart sounds.  Pulmonary:     Effort: Pulmonary effort is normal.     Breath sounds: Normal breath sounds.  Abdominal:     General: Bowel sounds are normal.     Palpations: Abdomen is soft.  Musculoskeletal:        General: Normal range of motion.     Cervical back: Normal range of motion and neck supple.  Skin:    General: Skin is warm.     Capillary Refill: Capillary refill takes less than 2 seconds.  Psychiatric:         Mood and Affect: Mood normal.        Behavior: Behavior normal.    ED Results / Procedures / Treatments   Labs (all labs ordered are listed, but only abnormal results are displayed) Labs Reviewed  BASIC METABOLIC PANEL  CBC  TROPONIN I (HIGH SENSITIVITY)  TROPONIN I (HIGH SENSITIVITY)    EKG EKG Interpretation  Date/Time:  Friday October 02 2020 17:41:52 EDT Ventricular Rate:  65 PR Interval:  148 QRS Duration: 100 QT Interval:  426 QTC Calculation: 443 R Axis:   41 Text Interpretation: Normal sinus rhythm Normal ECG No significant change since last tracing Confirmed by Wandra Arthurs [phone]) on 10/02/2020 8:21:05 PM  Radiology CT Angio Head W or Wo Contrast  Result Date: 10/02/2020 CLINICAL DATA:  Dizziness, non-specific Dizziness, persistent/recurrent, cardiac or vascular cause suspected; headache, hypertension EXAM: CT ANGIOGRAPHY HEAD AND NECK TECHNIQUE: Multidetector CT imaging of the head and neck was performed using the standard protocol during bolus administration of intravenous contrast. Multiplanar CT image reconstructions and MIPs were obtained to evaluate the vascular anatomy. Carotid stenosis measurements (when applicable) are obtained utilizing NASCET criteria, using the distal internal carotid diameter as the denominator. CONTRAST:  170m OMNIPAQUE IOHEXOL 350 MG/ML SOLN COMPARISON:  None. FINDINGS: CT HEAD FINDINGS Brain: There is no mass, hemorrhage or extra-axial collection. The size and configuration of the ventricles and extra-axial CSF spaces are normal. There is no acute or chronic infarction. The brain parenchyma is normal. Skull: The visualized skull base, calvarium and extracranial soft tissues are normal. Sinuses/Orbits:  No fluid levels or advanced mucosal thickening of the visualized paranasal sinuses. No mastoid or middle ear effusion. The orbits are normal. CTA NECK FINDINGS SKELETON: There is no bony spinal canal stenosis. No lytic or blastic lesion. OTHER  NECK: Normal pharynx, larynx and major salivary glands. No cervical lymphadenopathy. Unremarkable thyroid gland. UPPER CHEST: No pneumothorax or pleural effusion. No nodules or masses. AORTIC ARCH: There is no calcific atherosclerosis of the aortic arch. There is no aneurysm, dissection or hemodynamically significant stenosis of the visualized portion of the aorta. Conventional 3 vessel aortic branching pattern. The visualized proximal subclavian arteries are widely patent. RIGHT CAROTID SYSTEM: Normal without aneurysm, dissection or stenosis. LEFT CAROTID SYSTEM: Normal without aneurysm, dissection or stenosis. VERTEBRAL ARTERIES: Left dominant configuration. Both origins are clearly patent. There is no dissection, occlusion or flow-limiting stenosis to the skull base (V1-V3 segments). CTA HEAD FINDINGS POSTERIOR CIRCULATION: --Vertebral arteries: Normal V4 segments. --Inferior cerebellar arteries: Normal. --Basilar artery: Normal. --Superior cerebellar arteries: Normal. --Posterior cerebral arteries (PCA): Normal. ANTERIOR CIRCULATION: --Intracranial internal carotid arteries: Normal. --Anterior cerebral arteries (ACA): Normal. Both A1 segments are present. Patent anterior communicating artery (a-comm). --Middle cerebral arteries (MCA): Normal. VENOUS SINUSES: As permitted by contrast timing, patent. ANATOMIC VARIANTS: None Review of the MIP images confirms the above findings. IMPRESSION: Normal CTA of the head and neck. Electronically Signed   By: Ulyses Jarred M.D.   On: 10/02/2020 21:53   DG Chest 2 View  Result Date: 10/02/2020 CLINICAL DATA:  70 year old male with chest pain. EXAM: CHEST - 2 VIEW COMPARISON:  Chest radiograph dated 10/23/2019 FINDINGS: The lungs are clear. There is no pleural effusion pneumothorax. The cardiac silhouette is within limits. Degenerative changes of the spine. No acute osseous pathology. Lower cervical ACDF. IMPRESSION: No active cardiopulmonary disease. Electronically Signed    By: Anner Crete M.D.   On: 10/02/2020 18:14   CT Angio Neck W and/or Wo Contrast  Result Date: 10/02/2020 CLINICAL DATA:  Dizziness, non-specific Dizziness, persistent/recurrent, cardiac or vascular cause suspected; headache, hypertension EXAM: CT ANGIOGRAPHY HEAD AND NECK TECHNIQUE: Multidetector CT imaging of the head and neck was performed using the standard protocol during bolus administration of intravenous contrast. Multiplanar CT image reconstructions and MIPs were obtained to evaluate the vascular anatomy. Carotid stenosis measurements (when applicable) are obtained utilizing NASCET criteria, using the distal internal carotid diameter as the denominator. CONTRAST:  167m OMNIPAQUE IOHEXOL 350 MG/ML SOLN COMPARISON:  None. FINDINGS: CT HEAD FINDINGS Brain: There is no mass, hemorrhage or extra-axial collection. The size and configuration of the ventricles and extra-axial CSF spaces are normal. There is no acute or chronic infarction. The brain parenchyma is normal. Skull: The visualized skull base, calvarium and extracranial soft tissues are normal. Sinuses/Orbits: No fluid levels or advanced mucosal thickening of the visualized paranasal sinuses. No mastoid or middle ear effusion. The orbits are normal. CTA NECK FINDINGS SKELETON: There is no bony spinal canal stenosis. No lytic or blastic lesion. OTHER NECK: Normal pharynx, larynx and major salivary glands. No cervical lymphadenopathy. Unremarkable thyroid gland. UPPER CHEST: No pneumothorax or pleural effusion. No nodules or masses. AORTIC ARCH: There is no calcific atherosclerosis of the aortic arch. There is no aneurysm, dissection or hemodynamically significant stenosis of the visualized portion of the aorta. Conventional 3 vessel aortic branching pattern. The visualized proximal subclavian arteries are widely patent. RIGHT CAROTID SYSTEM: Normal without aneurysm, dissection or stenosis. LEFT CAROTID SYSTEM: Normal without aneurysm,  dissection or stenosis. VERTEBRAL ARTERIES: Left dominant configuration. Both origins  are clearly patent. There is no dissection, occlusion or flow-limiting stenosis to the skull base (V1-V3 segments). CTA HEAD FINDINGS POSTERIOR CIRCULATION: --Vertebral arteries: Normal V4 segments. --Inferior cerebellar arteries: Normal. --Basilar artery: Normal. --Superior cerebellar arteries: Normal. --Posterior cerebral arteries (PCA): Normal. ANTERIOR CIRCULATION: --Intracranial internal carotid arteries: Normal. --Anterior cerebral arteries (ACA): Normal. Both A1 segments are present. Patent anterior communicating artery (a-comm). --Middle cerebral arteries (MCA): Normal. VENOUS SINUSES: As permitted by contrast timing, patent. ANATOMIC VARIANTS: None Review of the MIP images confirms the above findings. IMPRESSION: Normal CTA of the head and neck. Electronically Signed   By: Ulyses Jarred M.D.   On: 10/02/2020 21:53    Procedures Procedures   Medications Ordered in ED Medications  metoCLOPramide (REGLAN) injection 10 mg (10 mg Intravenous Given 10/02/20 2058)  diphenhydrAMINE (BENADRYL) injection 25 mg (25 mg Intravenous Given 10/02/20 2059)  iohexol (OMNIPAQUE) 350 MG/ML injection 100 mL (100 mLs Intravenous Contrast Given 10/02/20 2118)    ED Course  I have reviewed the triage vital signs and the nursing notes.  Pertinent labs & imaging results that were available during my care of the patient were reviewed by me and considered in my medical decision making (see chart for details).    MDM Rules/Calculators/A&P                          [name] is a 70 y.o. male who presented with headache and hypertension and chest pain. Patient had headache earlier and was noted to be hypertensive to 160.  Patient also had some chest pain as well.  I think likely symptomatic hypertension versus hypertensive urgency.  Plan to get CTA head and neck and CBC and BMP and troponin x2.  I doubt aortic dissection.    10:25 PM Troponin negative x2.  CTA did not show any dissection or bleed.  Also x-ray did not show any widened mediastinum.  Blood pressure came down to 146/84 after migraine cocktail.  I think he has symptomatic hypertension.  He ate some barbecue yesterday.  I think increased salt intake may have contributed to his hypertension.  I told him to eat low-salt and continue to check his blood pressures at home.  Stable for discharge   Final Clinical Impression(s) / ED Diagnoses Final diagnoses:  None    Rx / DC Orders ED Discharge Orders     None        Drenda Freeze, MD 10/02/20 2226

## 2020-10-02 NOTE — ED Notes (Signed)
EDP at bedside  

## 2020-10-02 NOTE — ED Provider Notes (Signed)
[facility]    CSN: [id number] Arrival date & time: 10/02/20  1647      History   Chief Complaint Chief Complaint  Patient presents with   Hypertension    HPI CHRISTIAAN JELLE is a 70 y.o. male presenting with concern for hypertension- BPs typically ranging 120s/80s, but they've gotten as high as 170/100 at home following eating barbeque. Medical history hypertension, prediabetes. Htn typically well controlled on losartan, metoprolol, he is taking this as directed. Endorses 5/10 throbbing headache, but denies vision changes, blurred vision, shortness of breath, chest pain, weakness, new dizziness. States he "never gets headaches" so this is significant for him.  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 06/25/2020 06/25/2020   Prolapsed internal hemorrhoids, grade 3, s/p ligation/pexy 06/25/2020 06/25/2020   Arthritis 09/07/2012   Gout 09/07/2012   Inguinal hernia 09/07/2012   History of hemorrhoidectomy 05/20/2011   Neuroma of foot     Past Surgical History:  Procedure Laterality Date   APPENDECTOMY  1968   arthroscopic knee surgery     x 3 each knee   CARDIAC CATHETERIZATION  2007   indigestion results normal per pt   cervical neck surgery  2018   plate and screws dr Rita Ohara   CHOLECYSTECTOMY  03/12/2004   laparospic   COLONOSCOPY  04/07/2020   polyp removed had total of 9 colonscopies   EVALUATION UNDER ANESTHESIA WITH HEMORRHOIDECTOMY N/A 06/25/2020   Procedure: EXCISION OF  DISTAL RECTAL MASS,  HEMORRHOIDAL LIGATION AND PEXY,  HEMORRHOIDECTOMY. ANORECTAL EXAMINATION UNDER ANESTHESIA;  Surgeon: Michael Boston, MD;  Location: [place];  Service: General;  Laterality: N/A;  GEN AND LOCAL   FOOT BONE EXCISION     bone spurs on left foot   FOOT SURGERY Bilateral    for bone spurs   [place]  2005 and 2009   bilateral   pre skin cancer areas removed from face  03/2019   10-07-2019 spots removed from face and ears   TONSILLECTOMY  1970   UPPER GI ENDOSCOPY  04/07/2020       Home Medications    Prior to Admission medications   Medication Sig Start Date End Date Taking? Authorizing Provider  acetaminophen (TYLENOL) 650 MG CR tablet Take 650 mg by mouth every 8 (eight) hours as needed for pain.    [provider]  allopurinol (ZYLOPRIM) 300 MG tablet Take 300 mg by mouth daily.    [provider]  B Complex-C (SUPER B COMPLEX PO) Take by mouth daily.    [provider]  betamethasone dipropionate (DIPROLENE) 0.05 % cream Apply 1 application topically 2 (two) times daily as needed (rash).    [provider]  Chlorpheniramine-Phenylephrine (SINUS & ALLERGY PO) Take 10 mg by mouth daily. Brand  name--- Alloclear    [provider]  cholecalciferol (VITAMIN D) 1000 units tablet Take 2,000 Units by mouth daily.    [provider]  colchicine 0.6 MG tablet Take 0.6 mg by mouth daily as needed (gout pain).    [provider]  dextromethorphan-guaiFENesin (ROBITUSSIN-DM) 10-100 MG/5ML liquid Take by mouth every 4 (four) hours as needed for cough. TWO TEASPOONS    [provider]  diazepam (VALIUM) 5 MG tablet Take 1 tablet (5 mg total) by mouth every 6 (six) hours as needed for muscle spasms (difficulty urinating). 06/25/20   Michael Boston, MD  diclofenac sodium (VOLTAREN) 1 % GEL Apply 2 g topically 4 (four) times daily as needed (pain).    [provider]  fidaxomicin (DIFICID) 200 MG TABS tablet Take 1 tablet (200 mg total) by mouth 2 (two) times daily. 07/02/20   Lacretia Leigh, MD  fluticasone [facility]) 50 MCG/ACT nasal spray Place 1-2 sprays into both nostrils daily as needed for allergies or rhinitis.    [provider]  gabapentin (NEURONTIN) 300 MG capsule Take 300 mg by mouth 2 (two) times daily. Takes one capsule in am and two capsules in pm    [provider]  ketoconazole (NIZORAL) 2 % cream Apply 1 application topically daily as needed for irritation.    [provider]  loperamide (IMODIUM) 2 MG capsule Take 1 capsule (2 mg total) by mouth 4 (four) times daily as needed for diarrhea or loose stools. 07/02/20   Mesner, Corene Cornea, MD  losartan (COZAAR) 100 MG tablet Take 100 mg by mouth daily.    [provider]  metoprolol tartrate (LOPRESSOR) 50 MG tablet Take 25 mg by mouth 2 (two) times daily.    [provider]  Multiple Vitamins-Minerals (PRESERVISION AREDS) CAPS Take 1 capsule by mouth 2 (two) times daily.    [provider]  Naproxen Sod-diphenhydrAMINE 220-25 MG TABS Take 2 tablets by mouth at bedtime as needed (pain/sleep).    [provider]  omeprazole (PRILOSEC) 20 MG capsule Take 20 mg by mouth at bedtime. And as needed    [provider]  oxyCODONE (OXY IR/ROXICODONE) 5 MG immediate release tablet Take 1-2 tablets (5-10 mg total) by mouth every 6 (six) hours as needed for moderate pain, severe pain or breakthrough pain. 06/25/20   Michael Boston, MD  simvastatin (ZOCOR) 40 MG tablet Take 40 mg by mouth at bedtime.    [provider]  tiZANidine (ZANAFLEX) 4 MG tablet Take 4 mg by mouth every 8 (eight) hours as needed for muscle spasms.    [provider]  Triprolidine-Pseudoephedrine (ANTIHISTAMINE PO) Take 1-2 tablets by mouth every 4 (four) hours as needed. [facility]    [provider]    Family  History Family History  Problem Relation Age of Onset   Diabetes Mother    Heart disease Mother    Other Mother        dementia   Cancer Father        colon   Heart disease Father        heart attack   Diabetes Father    Diabetes Sister    Hypertension Sister    Hypertension Sister    Diabetes Sister    COPD Brother    Heart disease Brother    Other Brother        dementia    Social History Social History   Tobacco Use   Smoking status:  Never   Smokeless tobacco: Never  Vaping Use   Vaping Use: Never used  Substance Use Topics   Alcohol use: Yes    Comment: occ beer   Drug use: Never     Allergies   Codeine, Dilaudid [hydromorphone hcl], Demerol, Other, and Zoster vac recomb adjuvanted   Review of Systems Review of Systems  Constitutional:  Negative for appetite change, chills, fatigue and fever.  HENT:  Negative for congestion, sinus pressure, sore throat, trouble swallowing and voice change.   Eyes:  Negative for photophobia, pain, discharge, redness, itching and visual disturbance.  Respiratory:  Negative for cough, chest tightness and shortness of breath.   Cardiovascular:  Negative for chest pain, palpitations and leg swelling.  Gastrointestinal:  Negative for abdominal pain, constipation, diarrhea, nausea and vomiting.  Genitourinary:  Negative for dysuria, flank pain, frequency and urgency.  Musculoskeletal:  Negative for back pain, gait problem, myalgias, neck pain and neck stiffness.  Neurological:  Positive for headaches. Negative for dizziness, tremors, seizures, syncope, facial asymmetry, speech difficulty, weakness, light-headedness and numbness.  Psychiatric/Behavioral:  Negative for agitation, decreased concentration, dysphoric mood, hallucinations and suicidal ideas. The patient is not nervous/anxious.   All other systems reviewed and are negative.   Physical Exam Triage Vital Signs ED Triage Vitals  Enc Vitals Group     BP      Pulse       Resp      Temp      Temp src      SpO2      Weight      Height      Head Circumference      Peak Flow      Pain Score      Pain Loc      Pain Edu?      Excl. in [place]?    No data found.  Updated Vital Signs BP (!) 191/103   Pulse 76   Temp 98 F (36.7 C) (Oral)   Resp 20   SpO2 98%   Visual Acuity Right Eye Distance:   Left Eye Distance:   Bilateral Distance:    Right Eye Near:   Left Eye Near:    Bilateral Near:     Physical Exam Vitals reviewed.  Constitutional:      Appearance: Normal appearance. He is not diaphoretic.  HENT:     Head: Normocephalic and atraumatic.     Mouth/Throat:     Mouth: Mucous membranes are moist.  Eyes:     Extraocular Movements: Extraocular movements intact.     Pupils: Pupils are equal, round, and reactive to light.  Cardiovascular:     Rate and Rhythm: Normal rate and regular rhythm.     Pulses:          Radial pulses are 2+ on the right side and 2+ on the left side.     Heart sounds: Normal heart sounds.  Pulmonary:     Effort: Pulmonary effort is normal.     Breath sounds: Normal breath sounds.  Abdominal:     Palpations: Abdomen is soft.     Tenderness: There is no abdominal tenderness. There is no guarding or rebound.  Musculoskeletal:     Right lower leg: No edema.     Left lower leg: No edema.  Skin:    General: Skin is warm.     Capillary Refill: Capillary refill takes less than 2 seconds.  Neurological:     General: No focal  deficit present.     Mental Status: He is alert and oriented to person, place, and time.     Comments: PERRLA, EOMI, CN 2-12 grossly intact, negative pronator drift, negative rhomberg, normal fingers to thumb. Sensation and strength intact upper and lower extremities.  Psychiatric:        Mood and Affect: Mood normal.        Behavior: Behavior normal.        Thought Content: Thought content normal.        Judgment: Judgment normal.     UC Treatments / Results  Labs (all labs ordered are  listed, but only abnormal results are displayed) Labs Reviewed - No data to display  EKG   Radiology No results found.  Procedures Procedures (including critical care time)  Medications Ordered in UC Medications - No data to display  Initial Impression / Assessment and Plan / UC Course  I have reviewed the triage vital signs and the nursing notes.  Pertinent labs & imaging results that were available during my care of the patient were reviewed by me and considered in my medical decision making (see chart for details).     This patient is a very pleasant 70 y.o. year old male presenting with hypertensive urgency- BP 190/103 and pt with headaches. Taking his antihypertensives as directed. Benign neuro exam. He declines EMS in favor of transport by personal vehicle driven by wife.  He is hemodynamically stable for transport in personal vehicle this time.   Final Clinical Impressions(s) / UC Diagnoses   Final diagnoses:  Hypertensive urgency     Discharge Instructions      -Head straight to the emergency department for further evaluation and management of your blood pressure.  Significantly elevated blood pressure associated with headaches and dizziness is a red flag symptom for a stroke.  This needs to be monitored in the emergency department setting.  Please make sure that your wife drives you, and if symptoms get worse on the way like dizziness, headaches, new chest pain, shortness of breath, new weakness-stop and call 911 immediately.     ED Prescriptions   None    PDMP not reviewed this encounter.   Hazel Sams, PA-C 10/02/20 1711

## 2021-07-13 DIAGNOSIS — J329 Chronic sinusitis, unspecified: Secondary | ICD-10-CM | POA: Insufficient documentation

## 2023-01-19 DIAGNOSIS — M720 Palmar fascial fibromatosis [Dupuytren]: Secondary | ICD-10-CM | POA: Insufficient documentation

## 2023-02-16 DIAGNOSIS — M65331 Trigger finger, right middle finger: Secondary | ICD-10-CM | POA: Insufficient documentation

## 2023-03-18 IMAGING — CT CT ANGIO NECK
1 of 11 series · 5 of 33 positions shown · IV contrast (omnipaque)
Comparison: None.

CLINICAL DATA: Dizziness, non-specific Dizziness,
persistent/recurrent, cardiac or vascular cause suspected; headache,
hypertension

EXAM:
CT ANGIOGRAPHY HEAD AND NECK
TECHNIQUE: Multidetector CT imaging of the head and neck was performed using
the standard protocol during bolus administration of intravenous
contrast. Multiplanar CT image reconstructions and MIPs were
obtained to evaluate the vascular anatomy. Carotid stenosis
measurements (when applicable) are obtained utilizing NASCET
criteria, using the distal internal carotid diameter as the
denominator.
CONTRAST:  100mL OMNIPAQUE IOHEXOL 350 MG/ML SOLN

[Series 11: axial thin · axial · 0.39mm/px · z∈[-250,+12]mm · 5 of 394 slices shown]
[im 66/394  soft-tissue]
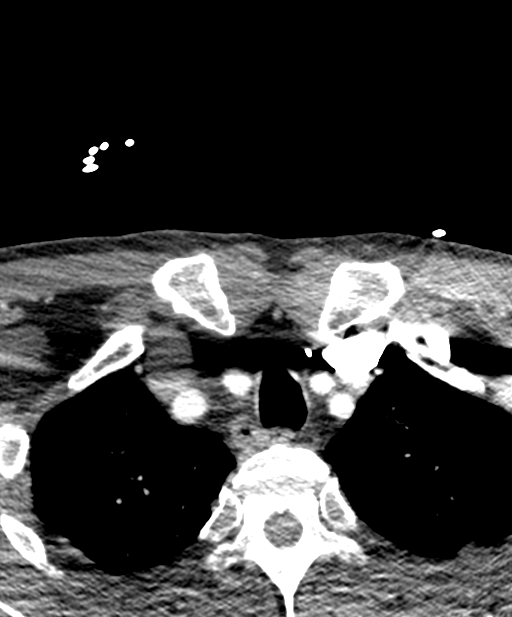
[im 132/394  bone]
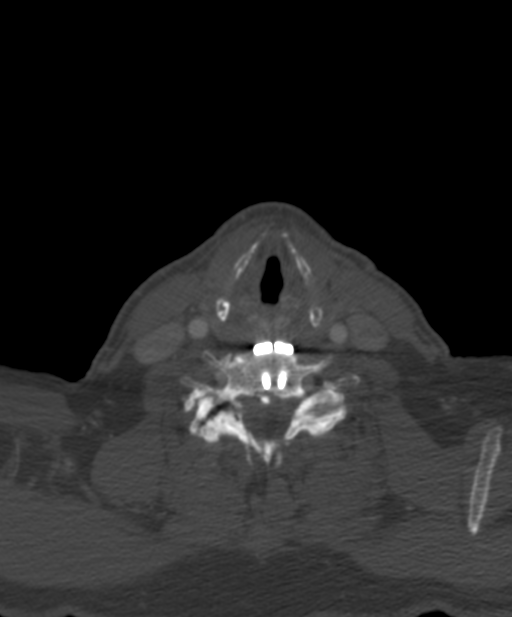
[im 197/394  soft-tissue]
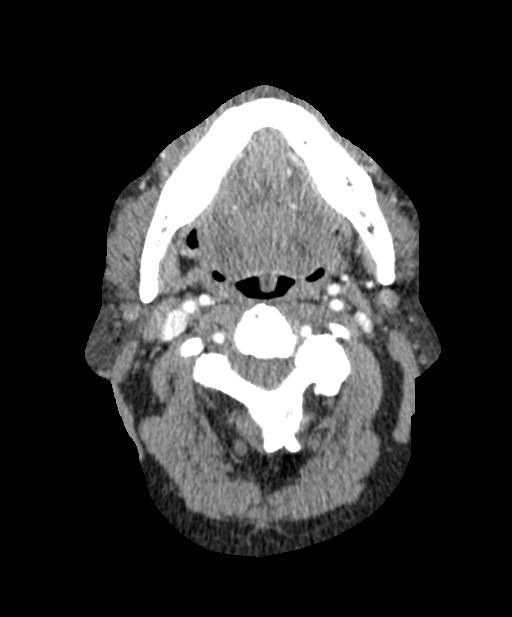
[im 263/394  bone]
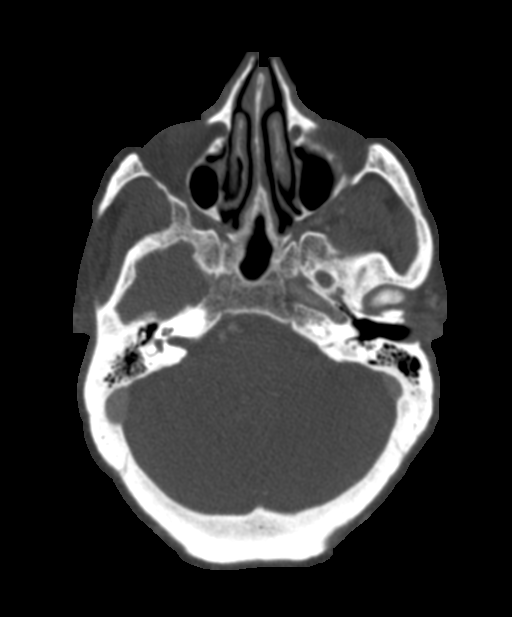
[im 328/394  soft-tissue]
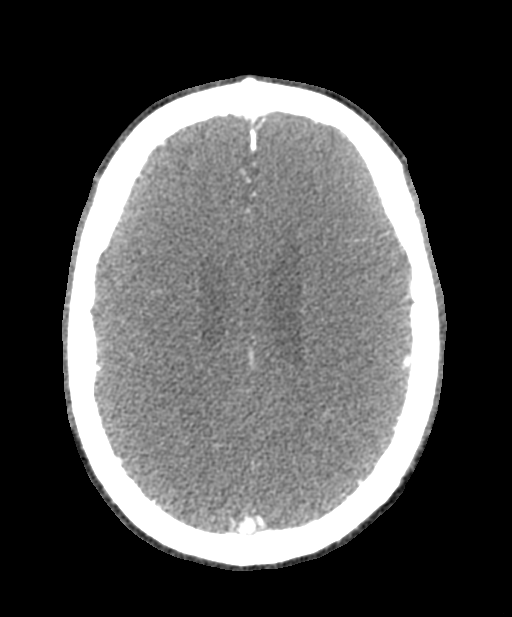

[5 of 33 positions shown; findings below may reference images not displayed]

FINDINGS: CT HEAD FINDINGS

Brain: There is no mass, hemorrhage or extra-axial collection. The
size and configuration of the ventricles and extra-axial CSF spaces
are normal. There is no acute or chronic infarction. The brain
parenchyma is normal.

Skull: The visualized skull base, calvarium and extracranial soft
tissues are normal.

Sinuses/Orbits: No fluid levels or advanced mucosal thickening of
the visualized paranasal sinuses. No mastoid or middle ear effusion.
The orbits are normal.

CTA NECK FINDINGS

SKELETON: There is no bony spinal canal stenosis. No lytic or
blastic lesion.

OTHER NECK: Normal pharynx, larynx and major salivary glands. No
cervical lymphadenopathy. Unremarkable thyroid gland.

UPPER CHEST: No pneumothorax or pleural effusion. No nodules or
masses.

AORTIC ARCH:

There is no calcific atherosclerosis of the aortic arch. There is no
aneurysm, dissection or hemodynamically significant stenosis of the
visualized portion of the aorta. Conventional 3 vessel aortic
branching pattern. The visualized proximal subclavian arteries are
widely patent.

RIGHT CAROTID SYSTEM: Normal without aneurysm, dissection or
stenosis.

LEFT CAROTID SYSTEM: Normal without aneurysm, dissection or
stenosis.

VERTEBRAL ARTERIES: Left dominant configuration. Both origins are
clearly patent. There is no dissection, occlusion or flow-limiting
stenosis to the skull base (V1-V3 segments).

CTA HEAD FINDINGS

POSTERIOR CIRCULATION:

--Vertebral arteries: Normal V4 segments.

--Inferior cerebellar arteries: Normal.

--Basilar artery: Normal.

--Superior cerebellar arteries: Normal.

--Posterior cerebral arteries (PCA): Normal.

ANTERIOR CIRCULATION:

--Intracranial internal carotid arteries: Normal.

--Anterior cerebral arteries (ACA): Normal. Both A1 segments are
present. Patent anterior communicating artery (a-comm).

--Middle cerebral arteries (MCA): Normal.

VENOUS SINUSES: As permitted by contrast timing, patent.

ANATOMIC VARIANTS: None

Review of the MIP images confirms the above findings.
IMPRESSION: Normal CTA of the head and neck.

## 2023-11-08 DIAGNOSIS — M19012 Primary osteoarthritis, left shoulder: Secondary | ICD-10-CM | POA: Insufficient documentation

## 2023-11-16 DIAGNOSIS — G8929 Other chronic pain: Secondary | ICD-10-CM | POA: Insufficient documentation

## 2024-01-03 ENCOUNTER — Ambulatory Visit
Admission: EM | Admit: 2024-01-03 | Discharge: 2024-01-03 | Disposition: A | Discharge status: Home / Self Care | Attending: Family Medicine | Admitting: Family Medicine

## 2024-01-03 ENCOUNTER — Encounter: Payer: Self-pay | Admitting: Emergency Medicine

## 2024-01-03 DIAGNOSIS — G629 Polyneuropathy, unspecified: Secondary | ICD-10-CM | POA: Insufficient documentation

## 2024-01-03 DIAGNOSIS — D131 Benign neoplasm of stomach: Secondary | ICD-10-CM | POA: Insufficient documentation

## 2024-01-03 DIAGNOSIS — K2930 Chronic superficial gastritis without bleeding: Secondary | ICD-10-CM | POA: Insufficient documentation

## 2024-01-03 DIAGNOSIS — K589 Irritable bowel syndrome without diarrhea: Secondary | ICD-10-CM | POA: Insufficient documentation

## 2024-01-03 DIAGNOSIS — R7303 Prediabetes: Secondary | ICD-10-CM | POA: Insufficient documentation

## 2024-01-03 DIAGNOSIS — I10 Essential (primary) hypertension: Secondary | ICD-10-CM | POA: Insufficient documentation

## 2024-01-03 DIAGNOSIS — Z860101 Personal history of adenomatous and serrated colon polyps: Secondary | ICD-10-CM | POA: Insufficient documentation

## 2024-01-03 DIAGNOSIS — E782 Mixed hyperlipidemia: Secondary | ICD-10-CM | POA: Insufficient documentation

## 2024-01-03 DIAGNOSIS — M2550 Pain in unspecified joint: Secondary | ICD-10-CM | POA: Insufficient documentation

## 2024-01-03 DIAGNOSIS — J309 Allergic rhinitis, unspecified: Secondary | ICD-10-CM | POA: Insufficient documentation

## 2024-01-03 DIAGNOSIS — R197 Diarrhea, unspecified: Secondary | ICD-10-CM | POA: Insufficient documentation

## 2024-01-03 DIAGNOSIS — R202 Paresthesia of skin: Secondary | ICD-10-CM | POA: Insufficient documentation

## 2024-01-03 DIAGNOSIS — E559 Vitamin D deficiency, unspecified: Secondary | ICD-10-CM | POA: Insufficient documentation

## 2024-01-03 DIAGNOSIS — R0981 Nasal congestion: Secondary | ICD-10-CM | POA: Insufficient documentation

## 2024-01-03 DIAGNOSIS — H6691 Otitis media, unspecified, right ear: Secondary | ICD-10-CM | POA: Diagnosis not present

## 2024-01-03 DIAGNOSIS — H60501 Unspecified acute noninfective otitis externa, right ear: Secondary | ICD-10-CM

## 2024-01-03 DIAGNOSIS — K219 Gastro-esophageal reflux disease without esophagitis: Secondary | ICD-10-CM | POA: Insufficient documentation

## 2024-01-03 DIAGNOSIS — H6090 Unspecified otitis externa, unspecified ear: Secondary | ICD-10-CM | POA: Insufficient documentation

## 2024-01-03 DIAGNOSIS — Z125 Encounter for screening for malignant neoplasm of prostate: Secondary | ICD-10-CM | POA: Insufficient documentation

## 2024-01-03 DIAGNOSIS — B360 Pityriasis versicolor: Secondary | ICD-10-CM | POA: Insufficient documentation

## 2024-01-03 DIAGNOSIS — M5416 Radiculopathy, lumbar region: Secondary | ICD-10-CM | POA: Insufficient documentation

## 2024-01-03 DIAGNOSIS — M62838 Other muscle spasm: Secondary | ICD-10-CM | POA: Insufficient documentation

## 2024-01-03 DIAGNOSIS — K5730 Diverticulosis of large intestine without perforation or abscess without bleeding: Secondary | ICD-10-CM | POA: Insufficient documentation

## 2024-01-03 MED ORDER — DOXYCYCLINE HYCLATE 100 MG PO CAPS: 100.0000 mg | ORAL_CAPSULE | Freq: Two times a day (BID) | ORAL | 0 refills | Status: DC

## 2024-01-03 MED ORDER — OFLOXACIN 0.3 % OT SOLN: 5.0000 [drp] | Freq: Two times a day (BID) | OTIC | 0 refills | Status: AC

## 2024-01-03 NOTE — Discharge Instructions (Addendum)
 Take doxycycline 100 mg --1 capsule 2 times daily for 7 days (this carries low risk of causing C. difficile)  -Floxin eardrops-5 drops in the affected ear 2 times daily for 7 days.  Follow-up with your primary care  You can also call the ENT office listed in this paperwork

## 2024-01-03 NOTE — ED Provider Notes (Signed)
 [facility]    CSN: [id number] Arrival date & time: 01/03/24  1527      History   Chief Complaint Chief Complaint  Patient presents with   Ear Fullness    HPI [name] is a 73 y.o. male.    Ear Fullness  Here for right ear pain for about 7 days.  It is also felt very full.  It can hurt to touch it some he tried putting some drops in but it did not really help. His left ear has not really bothered him much but maybe just a little.  No fever No cough or congestion or rhinorrhea.  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   Chronic superficial gastritis without bleeding 01/03/2024   Diarrhea 01/03/2024   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   Essential hypertension 01/03/2024   Gastroesophageal reflux disease 01/03/2024   History of adenomatous polyp of colon 01/03/2024   Joint pain 01/03/2024   Lumbar radiculopathy 01/03/2024   Mixed hyperlipidemia 01/03/2024   Colon spasm 01/03/2024   Muscle spasm 01/03/2024   Otitis externa 01/03/2024   Paresthesia 01/03/2024   Peripheral neuropathy 01/03/2024   Prediabetes 01/03/2024   Screening for prostate cancer 01/03/2024   Sinus congestion 01/03/2024   Tinea versicolor 01/03/2024   Vitamin D deficiency 01/03/2024   Chronic pain of left upper extremity 11/16/2023   Osteoarthritis of left glenohumeral joint 11/08/2023   Acquired trigger  finger of right middle finger 02/16/2023   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 01/19/2023   Chronic sinusitis 07/13/2021   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 06/25/2020 06/25/2020   Prolapsed internal hemorrhoids, grade 3, s/p ligation/pexy 06/25/2020 06/25/2020   Arthritis 09/07/2012   Gout, unspecified 09/07/2012   Inguinal hernia 09/07/2012   History of hemorrhoidectomy 05/20/2011   Neuroma of foot     Past Surgical History:  Procedure Laterality Date   APPENDECTOMY  1968   arthroscopic knee surgery     x 3 each knee   CARDIAC CATHETERIZATION  2007   indigestion results normal per pt   cervical neck surgery  2018   plate and screws dr mora   CHOLECYSTECTOMY  03/12/2004   laparospic   COLONOSCOPY  04/07/2020   polyp removed had total of 9 colonscopies   EVALUATION UNDER ANESTHESIA WITH HEMORRHOIDECTOMY N/A 06/25/2020   Procedure: EXCISION OF DISTAL RECTAL MASS,  HEMORRHOIDAL LIGATION AND PEXY,  HEMORRHOIDECTOMY. ANORECTAL EXAMINATION UNDER ANESTHESIA;  Surgeon: Sheldon Standing, MD;  Location: [facility];  Service: General;  Laterality: N/A;  GEN AND LOCAL   FOOT BONE EXCISION     bone spurs on left foot   FOOT SURGERY Bilateral    for bone spurs   HEMORRHOID SURGERY  1972   KNEE ARTHROPLASTY  2005 and 2009   bilateral   pre skin cancer areas removed from face  03/2019   10-07-2019 spots removed from face and ears   TONSILLECTOMY  1970   UPPER GI ENDOSCOPY  04/07/2020       Home Medications    Prior to Admission medications   Medication Sig Start Date End Date Taking? Authorizing Provider  albuterol (VENTOLIN HFA) 108 (90 Base) MCG/ACT inhaler 2 puffs as needed Inhalation every 4 hrs 07/01/22  Yes [provider]  amLODipine (NORVASC) 5 MG tablet 1 tablet Orally Once a day; Duration: 90 days 04/05/22  Yes [provider]  azithromycin (ZITHROMAX) 250 MG tablet 2 now then one a day Orally 04/24/23  Yes [provider]   cetirizine (ZYRTEC) 10 MG tablet 1 tablet Orally Once a day; Duration: 30 days 08/06/21  Yes [provider]  doxycycline (VIBRAMYCIN) 100 MG capsule Take 1 capsule (100 mg total) by mouth 2 (two) times daily for 7 days. 01/03/24 01/10/24 Yes Riva Sesma K, MD  montelukast (SINGULAIR) 10 MG tablet TAKE 1 TABLET BY MOUTH EVERY DAY FOR 90 DAYS 08/06/21  Yes [provider]  ofloxacin (FLOXIN) 0.3 % OTIC solution Place 5 drops into both ears 2 (two) times daily for 7 days. 01/03/24 01/10/24 Yes Landrie Beale K, MD  rosuvastatin (CRESTOR) 20 MG tablet 1 tablet Orally Once a day; Duration: 90 days 01/12/23  Yes [provider]  tamsulosin (FLOMAX) 0.4 MG CAPS capsule TAKE 1 CAPSULE BY MOUTH EVERYDAY AT BEDTIME 07/04/21  Yes [provider]  acetaminophen  (TYLENOL ) 650 MG CR tablet Take 650 mg by mouth every 8 (eight) hours as needed for pain.    [provider]  allopurinol (ZYLOPRIM) 300 MG tablet Take 300 mg by mouth daily.    [provider]  amLODipine (NORVASC) 2.5 MG tablet 1 tablet  orally once a day    [provider]  B Complex CAPS 1 capsule.    [provider]  B Complex-C (SUPER B COMPLEX PO) Take by mouth daily.    [provider]  betamethasone dipropionate (DIPROLENE) 0.05 % cream Apply 1 application topically 2 (two) times daily as needed (rash).    [provider]  carvedilol (COREG) 12.5 MG tablet 1 tablet with food Orally Twice a day; Duration: 90 days    [provider]  Chlorpheniramine-Phenylephrine (SINUS & ALLERGY PO) Take 10 mg by mouth daily. Brand name--- Alloclear    [provider]  cholecalciferol (VITAMIN D) 1000 units tablet Take 2,000 Units by mouth daily.    [provider]  colchicine 0.6 MG tablet Take 0.6 mg by mouth daily as needed (gout pain).    [provider]  colestipol (COLESTID) 1 g tablet TAKE 2 TABLETS BY MOUTH ONCE  DAILY;  Duration: 90    [provider]  Colestipol HCl (COLESTID PO)     [provider]  dextromethorphan-guaiFENesin (ROBITUSSIN-DM) 10-100 MG/5ML liquid Take by mouth every 4 (four) hours as needed for cough. TWO TEASPOONS    [provider]  diazepam  (VALIUM ) 5 MG tablet Take 1 tablet (5 mg total) by mouth every 6 (six) hours as needed for muscle spasms (difficulty urinating). 06/25/20   Sheldon Standing, MD  diclofenac sodium (VOLTAREN) 1 % GEL Apply 2 g topically 4 (four) times daily as needed (pain).    [provider]  fidaxomicin  (DIFICID ) 200 MG TABS tablet Take 1 tablet (200  mg total) by mouth 2 (two) times daily. 07/02/20   Dasie Faden, MD  fluocinonide cream (LIDEX) 0.05 % 1 Application.    [provider]  fluticasone (FLONASE) 50 MCG/ACT nasal spray Place 1-2 sprays into both nostrils daily as needed for allergies or rhinitis.    [provider]  gabapentin  (NEURONTIN ) 300 MG capsule Take 300 mg by mouth 2 (two) times daily. Takes one capsule in am and two capsules in pm    [provider]  ketoconazole (NIZORAL) 2 % cream Apply 1 application topically daily as needed for irritation.    [provider]  loperamide  (IMODIUM ) 2 MG capsule Take 1 capsule (2 mg total) by mouth 4 (four) times daily as needed for diarrhea or loose stools. 07/02/20   Mesner, Selinda, MD  losartan (COZAAR) 100 MG tablet Take 100 mg by mouth daily.    [provider]  metoprolol tartrate (LOPRESSOR) 50 MG tablet Take 25 mg by mouth 2 (two) times daily.    [provider]  Multiple Vitamins-Minerals (PRESERVISION AREDS) CAPS Take 1 capsule by mouth 2 (two) times daily.    [provider]  Naproxen Sod-diphenhydrAMINE  220-25 MG TABS Take 2 tablets by mouth at bedtime as needed (pain/sleep).    [provider]  omeprazole (PRILOSEC) 20 MG capsule Take 20 mg by mouth at bedtime. And as needed    [provider]   Omeprazole Magnesium (PRILOSEC PO)     [provider]  oxyCODONE  (OXY IR/ROXICODONE ) 5 MG immediate release tablet Take 1-2 tablets (5-10 mg total) by mouth every 6 (six) hours as needed for moderate pain, severe pain or breakthrough pain. 06/25/20   Sheldon Standing, MD  simvastatin (ZOCOR) 40 MG tablet Take 40 mg by mouth at bedtime.    [provider]  Tamsulosin HCl (FLOMAX PO)     [provider]  tiZANidine (ZANAFLEX) 4 MG tablet Take 4 mg by mouth every 8 (eight) hours as needed for muscle spasms.    [provider]  Triprolidine-Pseudoephedrine (ANTIHISTAMINE PO) Take 1-2 tablets by mouth every 4 (four) hours as needed. [facility]    [provider]    Family History Family History  Problem Relation Age of Onset   Diabetes Mother    Heart disease Mother    Other Mother        dementia   Cancer Father        colon   Heart disease Father        heart attack   Diabetes Father    Diabetes Sister    Hypertension Sister    Hypertension Sister    Diabetes Sister    COPD Brother    Heart disease Brother    Other Brother        dementia    Social History Social History   Tobacco Use   Smoking status: Never    Passive exposure: Never   Smokeless tobacco: Never  Vaping Use   Vaping status: Never Used  Substance Use Topics   Alcohol use: Yes    Comment: occ beer   Drug use: Never     Allergies   Codeine; Hydromorphone hcl; Demerol ; Meperidine ; Meperidine  hcl; Other; Pneumococcal vac polyvalent; Pneumococcal vaccine; Zoster vac recomb adjuvanted; and Zoster vaccine recombinant, adjuvanted   Review of Systems Review of Systems   Physical Exam Triage Vital Signs ED Triage Vitals  Encounter Vitals Group     BP 01/03/24 1548 135/77  Girls Systolic BP Percentile --      Girls Diastolic BP Percentile --      Boys Systolic BP Percentile --      Boys Diastolic BP Percentile --      Pulse Rate 01/03/24 1548 61      Resp 01/03/24 1548 18     Temp 01/03/24 1548 98.1 F (36.7 C)     Temp Source 01/03/24 1548 Oral     SpO2 01/03/24 1548 97 %     Weight 01/03/24 1547 201 lb 15.1 oz (91.6 kg)     Height --      Head Circumference --      Peak Flow --      Pain Score 01/03/24 1547 7     Pain Loc --      Pain Education --      Exclude from Growth Chart --    No data found.  Updated Vital Signs BP 135/77 (BP Location: Left Arm)   Pulse 61   Temp 98.1 F (36.7 C) (Oral)   Resp 18   Wt 91.6 kg   SpO2 97%   BMI 25.93 kg/m   Visual Acuity Right Eye Distance:   Left Eye Distance:   Bilateral Distance:    Right Eye Near:   Left Eye Near:    Bilateral Near:     Physical Exam Vitals reviewed.  Constitutional:      General: He is not in acute distress.    Appearance: He is not ill-appearing, toxic-appearing or diaphoretic.  HENT:     Left Ear: Ear canal normal.     Ears:     Comments: Right pinna is mildly tender.  There is no swelling of the tissues of the pinna or tragus.  Right ear canal has some white discharge and it, some adherent to the wall.  I can only see a little bit of the tympanic membrane which is dull and gray.  Left ear canal is normal and left pinna membrane is gray and dull.    Nose: Nose normal.     Mouth/Throat:     Mouth: Mucous membranes are moist.     Pharynx: No oropharyngeal exudate or posterior oropharyngeal erythema.  Eyes:     Extraocular Movements: Extraocular movements intact.     Conjunctiva/sclera: Conjunctivae normal.     Pupils: Pupils are equal, round, and reactive to light.  Cardiovascular:     Rate and Rhythm: Normal rate and regular rhythm.     Heart sounds: No murmur heard. Pulmonary:     Effort: Pulmonary effort is normal.     Breath sounds: Normal breath sounds.  Musculoskeletal:     Cervical back: Neck supple.  Lymphadenopathy:     Cervical: No cervical adenopathy.  Skin:    Capillary Refill: Capillary refill takes less than 2  seconds.     Coloration: Skin is not jaundiced or pale.  Neurological:     General: No focal deficit present.     Mental Status: He is alert and oriented to person, place, and time.  Psychiatric:        Behavior: Behavior normal.      UC Treatments / Results  Labs (all labs ordered are listed, but only abnormal results are displayed) Labs Reviewed - No data to display  EKG   Radiology No results found.  Procedures Procedures (including critical care time)  Medications Ordered in UC Medications - No data to display  Initial Impression / Assessment and  Plan / UC Course  I have reviewed the triage vital signs and the nursing notes.  Pertinent labs & imaging results that were available during my care of the patient were reviewed by me and considered in my medical decision making (see chart for details).     He has a history of C. difficile. Floxin's is sent in for right otitis externa and since I cannot tell for sure that there is not a perforation in the tympanic membrane that is causing the discharge in the vault, doxycycline is sent in for that.  That seems to have the lowest possibility of causing him any problems with C. difficile. Final Clinical Impressions(s) / UC Diagnoses   Final diagnoses:  Acute otitis externa of right ear, unspecified type  Right otitis media, unspecified otitis media type     Discharge Instructions      Take doxycycline 100 mg --1 capsule 2 times daily for 7 days (this carries low risk of causing C. difficile)  -Floxin eardrops-5 drops in the affected ear 2 times daily for 7 days.  Follow-up with your primary care  You can also call the ENT office listed in this paperwork      ED Prescriptions     Medication Sig Dispense Auth. Provider   doxycycline (VIBRAMYCIN) 100 MG capsule Take 1 capsule (100 mg total) by mouth 2 (two) times daily for 7 days. 14 capsule Jashay Roddy K, MD   ofloxacin (FLOXIN) 0.3 % OTIC solution Place 5  drops into both ears 2 (two) times daily for 7 days. 5 mL Vonna Sharlet POUR, MD      PDMP not reviewed this encounter.   Vonna Sharlet POUR, MD 01/03/24 [phone]

## 2024-01-03 NOTE — ED Triage Notes (Signed)
 Pt presents c/o R ear fullness x 7 days. Pt states,  I have a problem with sinus infections and I thought that's what was going on. Except it's getting worse. I put some drops in it but now right around the edge of the ear is real tender and it feels like I got a big glob of something in my ear.

## 2024-01-06 ENCOUNTER — Other Ambulatory Visit: Payer: Self-pay

## 2024-01-06 ENCOUNTER — Emergency Department (HOSPITAL_BASED_OUTPATIENT_CLINIC_OR_DEPARTMENT_OTHER)

## 2024-01-06 ENCOUNTER — Emergency Department (HOSPITAL_BASED_OUTPATIENT_CLINIC_OR_DEPARTMENT_OTHER): Admission: EM | Admit: 2024-01-06 | Discharge: 2024-01-06 | Disposition: A | Discharge status: Home / Self Care

## 2024-01-06 ENCOUNTER — Encounter (HOSPITAL_BASED_OUTPATIENT_CLINIC_OR_DEPARTMENT_OTHER): Payer: Self-pay

## 2024-01-06 DIAGNOSIS — H7091 Unspecified mastoiditis, right ear: Secondary | ICD-10-CM | POA: Insufficient documentation

## 2024-01-06 DIAGNOSIS — I10 Essential (primary) hypertension: Secondary | ICD-10-CM | POA: Insufficient documentation

## 2024-01-06 DIAGNOSIS — Z79899 Other long term (current) drug therapy: Secondary | ICD-10-CM | POA: Insufficient documentation

## 2024-01-06 DIAGNOSIS — H6691 Otitis media, unspecified, right ear: Secondary | ICD-10-CM | POA: Insufficient documentation

## 2024-01-06 DIAGNOSIS — H9201 Otalgia, right ear: Secondary | ICD-10-CM | POA: Diagnosis present

## 2024-01-06 DIAGNOSIS — R519 Headache, unspecified: Secondary | ICD-10-CM | POA: Insufficient documentation

## 2024-01-06 DIAGNOSIS — H6690 Otitis media, unspecified, unspecified ear: Secondary | ICD-10-CM

## 2024-01-06 LAB — BASIC METABOLIC PANEL WITH GFR
Anion gap: 14 (ref 5–15)
BUN: 12 mg/dL (ref 8–23)
CO2: 21 mmol/L — ABNORMAL LOW (ref 22–32)
Calcium: 9.2 mg/dL (ref 8.9–10.3)
Chloride: 106 mmol/L (ref 98–111)
Creatinine, Ser: 0.91 mg/dL (ref 0.61–1.24)
GFR, Estimated: >60 mL/min (ref >60)
Glucose, Bld: 115 mg/dL — ABNORMAL HIGH (ref 70–99)
Potassium: 4 mmol/L (ref 3.5–5.1)
Sodium: 141 mmol/L (ref 135–145)

## 2024-01-06 LAB — CBC
HCT: 39.5 % (ref 39.0–52.0)
Hemoglobin: 13.4 g/dL (ref 13.0–17.0)
MCH: 31.3 pg (ref 26.0–34.0)
MCHC: 33.9 g/dL (ref 30.0–36.0)
MCV: 92.3 fL (ref 80.0–100.0)
Platelets: 175 K/uL (ref 150–400)
RBC: 4.28 MIL/uL (ref 4.22–5.81)
RDW: 13.6 % (ref 11.5–15.5)
WBC: 8.5 K/uL (ref 4.0–10.5)
nRBC: 0 % (ref 0.0–0.2)

## 2024-01-06 LAB — TROPONIN T, HIGH SENSITIVITY
Troponin T High Sensitivity: <15 ng/L (ref 0–19)
Troponin T High Sensitivity: <15 ng/L (ref 0–19)

## 2024-01-06 MED ORDER — AMOXICILLIN-POT CLAVULANATE 875-125 MG PO TABS
1.0000 | ORAL_TABLET | Freq: Once | ORAL | Status: AC
  Administered 2024-01-06: 1 via ORAL
  Filled 2024-01-06: qty 1

## 2024-01-06 MED ORDER — OXYCODONE-ACETAMINOPHEN 5-325 MG PO TABS: 1.0000 | ORAL_TABLET | Freq: Four times a day (QID) | ORAL | 0 refills | Status: AC | PRN

## 2024-01-06 MED ORDER — IOHEXOL 300 MG/ML  SOLN
75.0000 mL | Freq: Once | INTRAMUSCULAR | Status: AC | PRN
  Administered 2024-01-06: 75 mL via INTRAVENOUS

## 2024-01-06 MED ORDER — ONDANSETRON HCL 4 MG/2ML IJ SOLN
4.0000 mg | Freq: Once | INTRAMUSCULAR | Status: AC
  Administered 2024-01-06: 4 mg via INTRAVENOUS
  Filled 2024-01-06: qty 2

## 2024-01-06 MED ORDER — AMOXICILLIN-POT CLAVULANATE 875-125 MG PO TABS: 1.0000 | ORAL_TABLET | Freq: Two times a day (BID) | ORAL | 0 refills | Status: AC

## 2024-01-06 MED ORDER — MORPHINE SULFATE (PF) 2 MG/ML IV SOLN
2.0000 mg | Freq: Once | INTRAVENOUS | Status: AC
  Administered 2024-01-06: 2 mg via INTRAVENOUS
  Filled 2024-01-06: qty 1

## 2024-01-06 NOTE — ED Triage Notes (Signed)
 Pt states he has been seeing a doctor his right ear is totally blocked up for 2 weeks. Says he's been checking his blood pressure and blood sugar this am. Pt has results in a notebook. Results sound WNL. Pt feels inflammation on the outside of his ear - headache in the right side of face. Pt says his head feels like it's going to explode.

## 2024-01-06 NOTE — ED Provider Notes (Signed)
 [place] [facility] Provider Note   CSN: [id number] Arrival date & time: 01/06/24  1000     Patient presents with: Headache   [name] is a 73 y.o. male.   73 year old male with past medical history of hypertension and hyperlipidemia presenting to the emergency department today with concern for some chest discomfort and palpitations.  Patient states he also been having a headache and some right ear pain.  He states that he has been treated for an earache on the right side and is on some antibiotic eardrops and doxycycline.  He has been on this for 2 days.  Reports that he is having a right sided throbbing headache with this.  Denies any associated fevers.  He states he came in today primarily because his heart rate was elevated at home and he was having palpitations and some chest pressure.  He came to the emergency department at time for further evaluation.  He thinks that is symptoms in regards to his headache and ear pain have not really changed much and starting antibiotics.   Headache      Prior to Admission medications   Medication Sig Start Date End Date Taking? Authorizing Provider  amoxicillin-clavulanate (AUGMENTIN) 875-125 MG tablet Take 1 tablet by mouth every 12 (twelve) hours. 01/06/24  Yes Ula Prentice SAUNDERS, MD  oxyCODONE -acetaminophen  (PERCOCET/ROXICET) 5-325 MG tablet Take 1 tablet by mouth every 6 (six) hours as needed for severe pain (pain score 7-10). 01/06/24  Yes Ula Prentice SAUNDERS, MD  albuterol (VENTOLIN HFA) 108 (90 Base) MCG/ACT inhaler 2 puffs as needed Inhalation every 4 hrs 07/01/22   [provider]  allopurinol (ZYLOPRIM) 300 MG tablet Take 300 mg by mouth daily.    [provider]  amLODipine (NORVASC) 2.5 MG tablet 1 tablet  orally once a day    [provider]  amLODipine (NORVASC) 5 MG tablet 1 tablet Orally Once a day; Duration: 90 days 04/05/22   [provider]  B Complex CAPS  1 capsule.    [provider]  B Complex-C (SUPER B COMPLEX PO) Take by mouth daily.    [provider]  betamethasone dipropionate (DIPROLENE) 0.05 % cream Apply 1 application topically 2 (two) times daily as needed (rash).    [provider]  carvedilol (COREG) 12.5 MG tablet 1 tablet with food Orally Twice a day; Duration: 90 days    [provider]  cetirizine (ZYRTEC) 10 MG tablet 1 tablet Orally Once a day; Duration: 30 days 08/06/21   [provider]  Chlorpheniramine-Phenylephrine (SINUS & ALLERGY PO) Take 10 mg by mouth daily. Brand name--- Alloclear    [provider]  cholecalciferol (VITAMIN D) 1000 units tablet Take 2,000 Units by mouth daily.    [provider]  colchicine 0.6 MG tablet Take 0.6 mg by mouth daily as needed (gout pain).    [provider]  colestipol (COLESTID) 1 g tablet TAKE 2 TABLETS BY MOUTH ONCE  DAILY; Duration: 90    [provider]  Colestipol HCl (COLESTID PO)     [provider]  dextromethorphan-guaiFENesin (ROBITUSSIN-DM) 10-100 MG/5ML liquid Take by mouth every 4 (four) hours as needed for cough. TWO TEASPOONS    [provider]  diazepam  (VALIUM ) 5 MG tablet Take 1 tablet (5 mg total) by mouth every 6 (six) hours as needed for muscle spasms (difficulty urinating). 06/25/20   Sheldon Standing, MD  diclofenac sodium (VOLTAREN) 1 % GEL  Apply 2 g topically 4 (four) times daily as needed (pain).    [provider]  fluocinonide cream (LIDEX) 0.05 % 1 Application.    [provider]  fluticasone (FLONASE) 50 MCG/ACT nasal spray Place 1-2 sprays into both nostrils daily as needed for allergies or rhinitis.    [provider]  gabapentin  (NEURONTIN ) 300 MG capsule Take 300 mg by mouth 2 (two) times daily. Takes one capsule in am and two capsules in pm    [provider]  ketoconazole (NIZORAL) 2 % cream Apply 1 application topically  daily as needed for irritation.    [provider]  loperamide  (IMODIUM ) 2 MG capsule Take 1 capsule (2 mg total) by mouth 4 (four) times daily as needed for diarrhea or loose stools. 07/02/20   Mesner, Selinda, MD  losartan (COZAAR) 100 MG tablet Take 100 mg by mouth daily.    [provider]  metoprolol tartrate (LOPRESSOR) 50 MG tablet Take 25 mg by mouth 2 (two) times daily.    [provider]  montelukast (SINGULAIR) 10 MG tablet TAKE 1 TABLET BY MOUTH EVERY DAY FOR 90 DAYS 08/06/21   [provider]  Multiple Vitamins-Minerals (PRESERVISION AREDS) CAPS Take 1 capsule by mouth 2 (two) times daily.    [provider]  Naproxen Sod-diphenhydrAMINE  220-25 MG TABS Take 2 tablets by mouth at bedtime as needed (pain/sleep).    [provider]  ofloxacin (FLOXIN) 0.3 % OTIC solution Place 5 drops into both ears 2 (two) times daily for 7 days. 01/03/24 01/10/24  Banister, Pamela K, MD  omeprazole (PRILOSEC) 20 MG capsule Take 20 mg by mouth at bedtime. And as needed    [provider]  Omeprazole Magnesium (PRILOSEC PO)     [provider]  rosuvastatin (CRESTOR) 20 MG tablet 1 tablet Orally Once a day; Duration: 90 days 01/12/23   [provider]  simvastatin (ZOCOR) 40 MG tablet Take 40 mg by mouth at bedtime.    [provider]  tamsulosin (FLOMAX) 0.4 MG CAPS capsule TAKE 1 CAPSULE BY MOUTH EVERYDAY AT BEDTIME 07/04/21   [provider]  Tamsulosin HCl (FLOMAX PO)     [provider]  tiZANidine (ZANAFLEX) 4 MG tablet Take 4 mg by mouth every 8 (eight) hours as needed for muscle spasms.    [provider]  Triprolidine-Pseudoephedrine (ANTIHISTAMINE PO) Take 1-2 tablets by mouth every 4 (four) hours as needed. [facility]    [provider]    Allergies: Codeine; Hydromorphone hcl; Demerol ; Meperidine ; Meperidine  hcl; Other; Pneumococcal vac polyvalent; Pneumococcal  vaccine; Zoster vac recomb adjuvanted; and Zoster vaccine recombinant, adjuvanted    Review of Systems  Neurological:  Positive for headaches.  All other systems reviewed and are negative.   Updated Vital Signs BP 111/65   Pulse 64   Temp 98.1 F (36.7 C)   Resp 13   Ht 6' 2 (1.88 m)   Wt 93.9 kg   SpO2 100%   BMI 26.58 kg/m   Physical Exam Vitals and nursing note reviewed.   Gen: NAD Eyes: PERRL, EOMI HEENT: no oropharyngeal swelling, right TM is bulging and erythematous with minimal cerumen noted, there is some mild pain with manipulation of the external ear Neck: trachea midline Resp: clear to auscultation bilaterally Card: RRR, no murmurs, rubs, or gallops Abd: nontender, nondistended Extremities: no calf tenderness, no edema Vascular: 2+ radial pulses bilaterally, 2+ DP pulses bilaterally Neuro: Cranial nerves intact, equal strength  and sensation throughout bilateral upper and lower extremities with no dysmetria on finger-to-nose testing Skin: no rashes Psyc: acting appropriately   (all labs ordered are listed, but only abnormal results are displayed) Labs Reviewed  BASIC METABOLIC PANEL WITH GFR - Abnormal; Notable for the following components:      Result Value   CO2 21 (*)    Glucose, Bld 115 (*)    All other components within normal limits  CBC  TROPONIN T, HIGH SENSITIVITY  TROPONIN T, HIGH SENSITIVITY    EKG: EKG Interpretation Date/Time:  Saturday January 06 2024 10:20:08 EDT Ventricular Rate:  71 PR Interval:  157 QRS Duration:  93 QT Interval:  392 QTC Calculation: 426 R Axis:   46  Text Interpretation: Sinus rhythm Abnormal R-wave progression, early transition Confirmed by Ula Barter [phone]) on 01/06/2024 10:50:18 AM  Radiology: CT Temporal Bones W Contrast Result Date: 01/06/2024 EXAM: CT TEMPORAL BONES WITH CONTRAST 01/06/2024 12:10:00 PM TECHNIQUE: CT of the temporal bones was performed with the administration of 75 mL of iohexol   (OMNIPAQUE ) 300 MG/ML solution. Multiplanar reformatted images are provided for review. Automated exposure control, iterative reconstruction, and/or weight based adjustment of the mA/kV was utilized to reduce the radiation dose to as low as reasonably achievable. COMPARISON: None available. CLINICAL HISTORY: Mastoiditis. Pt states he has been seeing a doctor his right ear is totally blocked up for 2 weeks. Says he's been checking his blood pressure and blood sugar this am. Pt has results in a notebook. Results sound WNL. Pt feels inflammation on the outside of his ear. Headache in the right side of face. Pt says his head feels like it's going to explode. FINDINGS: RIGHT TEMPORAL BONE: EXTERNAL AUDITORY CANAL: Partially opacified. The soft tissue component of the EAC demonstrates mucosal thickening and narrowing. Debris is present in the medial aspect of the EAC. The tympanic membrane is not discretely visualized. MIDDLE EAR CAVITY: Fluid or soft tissues present within the right middle ear cavity. Middle ear ossicles are intact. A tegmen tympani is imperceptible and spots. MASTOID AIR CELLS: A right mastoid effusion is present without osseous destruction or bone. INNER EAR: The cochlea and vestibule are unremarkable. Normal mineralization of the otic capsule. Normal semicircular canals. The vestibular aqueduct is not dilated. INTERNAL AUDITORY CANAL: Unremarkable. Normal bony canal of the facial nerve. LEFT TEMPORAL BONE: EXTERNAL AUDITORY CANAL: Clear. No bony erosion. Scutum is intact. MIDDLE EAR CAVITY: Clear. Ossicular chain is intact. MASTOID AIR CELLS: Clear. INNER EAR: The cochlea and vestibule are unremarkable. Normal mineralization of the otic capsule. Normal semicircular canals. The vestibular aqueduct is not dilated. INTERNAL AUDITORY CANAL: Unremarkable. Normal bony canal of the facial nerve. VASCULAR: Normal jugular bulbs. Normal carotid canals. BRAIN: Unremarkable. ORBITS: No acute abnormality.  SINUSES: Clear. Asymmetric right-sided skin thickening and subcutaneous stranding is present about the right ear. No discrete collection or focal lesion is present. No obstructing nasopharyngeal lesion is present. IMPRESSION: 1. Right middle ear effusion/soft tissue with associated mastoid effusion and no osseous destruction or erosion. Findings are consistent with otitis media and probable mastoiditis. 2. Right external auditory canal opacification with mucosal thickening, narrowing, and debris. Findings are consistent with otitis externa 3. Asymmetric right-sided skin thickening and subcutaneous stranding about the right ear without discrete collection or focal lesion. This likely represents some degree of cellulitis. Electronically signed by: Lonni Necessary MD 01/06/2024 12:33 PM EDT RP Workstation: [id number]   CT Head Wo Contrast Result Date: 01/06/2024 EXAM: CT HEAD WITHOUT CONTRAST 01/06/2024 12:10:00  PM TECHNIQUE: CT of the head was performed without the administration of intravenous contrast. Automated exposure control, iterative reconstruction, and/or weight based adjustment of the mA/kV was utilized to reduce the radiation dose to as low as reasonably achievable. COMPARISON: None available. CLINICAL HISTORY: Head trauma, intracranial arterial injury suspected. Patient reports right ear blockage for 2 weeks and headache in the right side of the face. FINDINGS: BRAIN AND VENTRICLES: No acute hemorrhage. No evidence of acute infarct. No hydrocephalus. No extra-axial collection. No mass effect or midline shift. ORBITS: Bilateral lens replacement. SINUSES: Right mastoid and middle ear effusion. SOFT TISSUES AND SKULL: No acute soft tissue abnormality. No skull fracture. IMPRESSION: 1. No acute intracranial abnormality. 2. Right mastoid and middle ear effusion. Electronically signed by: Lonni Necessary MD 01/06/2024 12:25 PM EDT RP Workstation: [id number]   DG Chest Port 1 View Result Date:  01/06/2024 CLINICAL DATA:  Pain. EXAM: PORTABLE CHEST 1 VIEW COMPARISON:  10/02/2020 FINDINGS: The cardiomediastinal contours are normal. The lungs are clear. Pulmonary vasculature is normal. No consolidation, pleural effusion, or pneumothorax. No acute osseous abnormalities are seen. IMPRESSION: No active disease. Electronically Signed   By: Andrea Gasman M.D.   On: 01/06/2024 12:14     Procedures   Medications Ordered in the ED  amoxicillin-clavulanate (AUGMENTIN) 875-125 MG per tablet 1 tablet (has no administration in time range)  morphine (PF) 2 MG/ML injection 2 mg (2 mg Intravenous Given 01/06/24 1101)  ondansetron  (ZOFRAN ) injection 4 mg (4 mg Intravenous Given 01/06/24 1059)  iohexol  (OMNIPAQUE ) 300 MG/ML solution 75 mL (75 mLs Intravenous Contrast Given 01/06/24 1151)                                    Medical Decision Making 73 year old male with past medical history of hypertension and hyperlipidemia presenting to the emergency department today with chest discomfort as well as some pain in his right ear along with right-sided headache that has been going on now for the past few days.  I will further evaluate the patient here with basic lab as well as an EKG, chest x-ray, and troponin further evaluation for ACS, pulmonary edema, pulmonary filtration, pneumothorax.  Will keep the patient on the cardiac monitor to evaluate for arrhythmias.  I will obtain a CT scan of the patient's head and temporal bones to evaluate for mastoiditis or acute traumatic injury as he does report that he fell and hit his head earlier this week before this started to occur.  He reports that this has occurred after the fall and he does have some findings concerning for possible otitis media.  He has not been on ideal antibiotics due to history of C. difficile but may need to be switched to Augmentin.  This being mostly localized around the right ear suspicion for temporal arteritis is low at this time.  I will  give patient morphine Zofran  for symptoms and reevaluate for ultimate disposition.  The patient's workup here was reassuring in regards to his labs.  CT scan does show otitis media and otitis externa with concerns for early mastoiditis.  I did call and discussed this with Dr. Albina who will see the patient in the clinic on Monday.  The patient was doing much better after the medications here and is hemodynamically stable with improved symptoms.  I do think that he is stable for discharge.  I will switch him from doxycycline to Augmentin for better  coverage for otitis media after discussion with ENT.  The patient will follow-up with return precautions.  I will give him a short supply of pain medication to take as needed until he is able to follow-up.  Amount and/or Complexity of Data Reviewed Labs: ordered. Radiology: ordered.  Risk Prescription drug management.        Final diagnoses:  Acute otitis media, unspecified otitis media type  Mastoiditis of right side    ED Discharge Orders          Ordered    oxyCODONE -acetaminophen  (PERCOCET/ROXICET) 5-325 MG tablet  Every 6 hours PRN        01/06/24 1409    amoxicillin-clavulanate (AUGMENTIN) 875-125 MG tablet  Every 12 hours        01/06/24 1410               Ula Prentice SAUNDERS, MD 01/06/24 [id number]

## 2024-01-06 NOTE — Discharge Instructions (Addendum)
 Please stop the doxycycline and start the Augmentin.  Please take the Percocet as needed for breakthrough pain.  I did call and discussed your case with the ENT doctor and they will like to see you in their clinic on Monday.  You should receive a call but if you do not hear anything by midmorning on Monday please call the office at the number provided.  Please return to the emergency department for worsening symptoms.  Please continue using the eardrops until you follow-up.  If you have worsening symptoms over the weekend please go to [facility], ER because we will likely need to start you on IV antibiotics and admit you at that point.  If you are feeling better please follow-up with the ENT doctor on Monday.
